# Patient Record
Sex: Male | Born: 1990 | Race: White | Hispanic: No | Marital: Married | Smoking: Current every day smoker
Health system: Southern US, Community
[De-identification: ages and names within clinical notes are randomized; demographics above are authoritative.]

## PROBLEM LIST (undated history)

## (undated) DIAGNOSIS — K259 Gastric ulcer, unspecified as acute or chronic, without hemorrhage or perforation: Secondary | ICD-10-CM

## (undated) DIAGNOSIS — F1110 Opioid abuse, uncomplicated: Secondary | ICD-10-CM

## (undated) DIAGNOSIS — K529 Noninfective gastroenteritis and colitis, unspecified: Secondary | ICD-10-CM

## (undated) DIAGNOSIS — Z765 Malingerer [conscious simulation]: Secondary | ICD-10-CM

## (undated) DIAGNOSIS — Z0289 Encounter for other administrative examinations: Secondary | ICD-10-CM

## (undated) DIAGNOSIS — K589 Irritable bowel syndrome without diarrhea: Secondary | ICD-10-CM

## (undated) DIAGNOSIS — A498 Other bacterial infections of unspecified site: Secondary | ICD-10-CM

## (undated) DIAGNOSIS — N200 Calculus of kidney: Secondary | ICD-10-CM

## (undated) DIAGNOSIS — G8929 Other chronic pain: Secondary | ICD-10-CM

## (undated) DIAGNOSIS — K8020 Calculus of gallbladder without cholecystitis without obstruction: Secondary | ICD-10-CM

## (undated) HISTORY — PX: OTHER SURGICAL HISTORY: SHX169

## (undated) HISTORY — PX: LITHOTRIPSY: SUR834

## (undated) HISTORY — PX: COLONOSCOPY: SHX174

---
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
## 2012-02-08 ENCOUNTER — Emergency Department (HOSPITAL_BASED_OUTPATIENT_CLINIC_OR_DEPARTMENT_OTHER)
Admission: EM | Admit: 2012-02-08 | Discharge: 2012-02-08 | Disposition: A | Discharge status: Home / Self Care | Payer: Self-pay | Attending: Emergency Medicine | Admitting: Emergency Medicine

## 2012-02-08 ENCOUNTER — Encounter (HOSPITAL_BASED_OUTPATIENT_CLINIC_OR_DEPARTMENT_OTHER): Payer: Self-pay | Admitting: *Deleted

## 2012-02-08 DIAGNOSIS — M549 Dorsalgia, unspecified: Secondary | ICD-10-CM | POA: Insufficient documentation

## 2012-02-08 DIAGNOSIS — Z87442 Personal history of urinary calculi: Secondary | ICD-10-CM | POA: Insufficient documentation

## 2012-02-08 DIAGNOSIS — F17200 Nicotine dependence, unspecified, uncomplicated: Secondary | ICD-10-CM | POA: Insufficient documentation

## 2012-02-08 DIAGNOSIS — N23 Unspecified renal colic: Secondary | ICD-10-CM | POA: Insufficient documentation

## 2012-02-08 DIAGNOSIS — R112 Nausea with vomiting, unspecified: Secondary | ICD-10-CM | POA: Insufficient documentation

## 2012-02-08 LAB — URINALYSIS, ROUTINE W REFLEX MICROSCOPIC
Bilirubin Urine: NEGATIVE
Leukocytes, UA: NEGATIVE
Nitrite: NEGATIVE
Specific Gravity, Urine: 1.025 (ref 1.005–1.030)
pH: 5.5 (ref 5.0–8.0)

## 2012-02-08 LAB — URINE MICROSCOPIC-ADD ON

## 2012-02-08 MED ORDER — MORPHINE SULFATE 4 MG/ML IJ SOLN
4.0000 mg | Freq: Once | INTRAMUSCULAR | Status: AC
  Administered 2012-02-08: 4 mg via INTRAVENOUS
  Filled 2012-02-08: qty 1

## 2012-02-08 MED ORDER — ONDANSETRON HCL 4 MG/2ML IJ SOLN
4.0000 mg | Freq: Once | INTRAMUSCULAR | Status: AC
Start: 2012-02-08 — End: 2012-02-08
  Administered 2012-02-08: 4 mg via INTRAVENOUS
  Filled 2012-02-08: qty 2

## 2012-02-08 MED ORDER — OXYCODONE-ACETAMINOPHEN 5-325 MG PO TABS: 2.0000 | ORAL_TABLET | ORAL | Status: DC | PRN

## 2012-02-08 MED ORDER — KETOROLAC TROMETHAMINE 30 MG/ML IJ SOLN
30.0000 mg | Freq: Once | INTRAMUSCULAR | Status: AC
  Administered 2012-02-08: 30 mg via INTRAVENOUS
  Filled 2012-02-08: qty 1

## 2012-02-08 MED ORDER — ONDANSETRON HCL 4 MG PO TABS: 4.0000 mg | ORAL_TABLET | Freq: Four times a day (QID) | ORAL | Status: DC

## 2012-02-08 NOTE — ED Notes (Signed)
Pt amb to room 7 with quick steady gait in nad. Pt states "I think I have another kidney stone" reports sudden onset of right flank pain at 4am with emesis due to pain. Pt is awake and alert, in nad.

## 2012-02-08 NOTE — ED Notes (Signed)
Pt. Aware urine is needed.  Unable to void at this time.

## 2012-02-08 NOTE — ED Provider Notes (Signed)
History     CSN: [id number]  Arrival date & time 02/08/12  1478   First MD Initiated Contact with Patient 02/08/12 [phone]      Chief Complaint  Patient presents with  . Flank Pain    (Consider location/radiation/quality/duration/timing/severity/associated sxs/prior treatment) HPI Comments: Patient presents with right flank pain that started suddenly at 4 AM today. He has a history of a kidney stone once before in August. This is also located on the right side and passed on its own the same day that he left the emergency room. He did not followup with a urologist. He states that he has a constant sharp throbbing pain to his right flank it radiates to his right groin. He has associated nausea and vomiting. Denies any fevers or difficulty urinating. He has not taken anything today for the pain. He states it feels the same as his past renal colic.  Patient is a 21 y.o. male presenting with flank pain.  Flank Pain Associated symptoms include abdominal pain. Pertinent negatives include no chest pain, no headaches and no shortness of breath.    History reviewed. No pertinent past medical history.  History reviewed. No pertinent past surgical history.  History reviewed. No pertinent family history.  History  Substance Use Topics  . Smoking status: Current Every Day Smoker  . Smokeless tobacco: Not on file  . Alcohol Use: No      Review of Systems  Constitutional: Negative for fever, chills, diaphoresis and fatigue.  HENT: Negative for congestion, rhinorrhea and sneezing.   Eyes: Negative.   Respiratory: Negative for cough, chest tightness and shortness of breath.   Cardiovascular: Negative for chest pain and leg swelling.  Gastrointestinal: Positive for nausea, vomiting and abdominal pain. Negative for diarrhea and blood in stool.  Genitourinary: Positive for flank pain. Negative for frequency, hematuria and difficulty urinating.  Musculoskeletal: Positive for back pain. Negative  for arthralgias.  Skin: Negative for rash.  Neurological: Negative for dizziness, speech difficulty, weakness, numbness and headaches.    Allergies  Review of patient's allergies indicates no known allergies.  Home Medications   Current Outpatient Rx  Name  Route  Sig  Dispense  Refill  . ONDANSETRON HCL 4 MG PO TABS   Oral   Take 1 tablet (4 mg total) by mouth every 6 (six) hours.   12 tablet   0   . OXYCODONE-ACETAMINOPHEN 5-325 MG PO TABS   Oral   Take 2 tablets by mouth every 4 (four) hours as needed for pain.   20 tablet   0   . PROMETHAZINE HCL 25 MG PO TABS   Oral   Take 1 tablet (25 mg total) by mouth every 6 (six) hours as needed for nausea.   30 tablet   0     BP 148/84  Pulse 76  Temp 98.2 F (36.8 C) (Oral)  Resp 18  SpO2 100%  Physical Exam  Constitutional: He is oriented to person, place, and time. He appears well-developed and well-nourished.  HENT:  Head: Normocephalic and atraumatic.  Eyes: Pupils are equal, round, and reactive to light.  Neck: Normal range of motion. Neck supple.  Cardiovascular: Normal rate, regular rhythm and normal heart sounds.   Pulmonary/Chest: Effort normal and breath sounds normal. No respiratory distress. He has no wheezes. He has no rales. He exhibits no tenderness.  Abdominal: Soft. Bowel sounds are normal. There is tenderness (positive tenderness to the right flank and right CVA area. There's no pain to  the testicular or inguinal area.). There is no rebound and no guarding.  Musculoskeletal: Normal range of motion. He exhibits no edema.  Lymphadenopathy:    He has no cervical adenopathy.  Neurological: He is alert and oriented to person, place, and time.  Skin: Skin is warm and dry. No rash noted.  Psychiatric: He has a normal mood and affect.    ED Course  Procedures (including critical care time)  Results for orders placed during the hospital encounter of 02/08/12  URINALYSIS, ROUTINE W REFLEX MICROSCOPIC       Component Value Range   Color, Urine AMBER (*) YELLOW   APPearance CLEAR  CLEAR   Specific Gravity, Urine 1.025  1.005 - 1.030   pH 5.5  5.0 - 8.0   Glucose, UA NEGATIVE  NEGATIVE mg/dL   Hgb urine dipstick MODERATE (*) NEGATIVE   Bilirubin Urine NEGATIVE  NEGATIVE   Ketones, ur NEGATIVE  NEGATIVE mg/dL   Protein, ur NEGATIVE  NEGATIVE mg/dL   Urobilinogen, UA 1.0  0.0 - 1.0 mg/dL   Nitrite NEGATIVE  NEGATIVE   Leukocytes, UA NEGATIVE  NEGATIVE  URINE MICROSCOPIC-ADD ON      Component Value Range   Squamous Epithelial / LPF FEW (*) RARE   WBC, UA 3-6  <3 WBC/hpf   RBC / HPF 11-20  <3 RBC/hpf   Bacteria, UA FEW (*) RARE   Urine-Other MUCOUS PRESENT     No results found.   1. Renal colic       MDM  Patient presents right flank pain consistent with his previous renal colic. His pain was controlled with analgesics in the emergency department. His urine does not appear to be infected but is consistent with a kidney stone. I did not feel that he needed further imaging today. I did encourage him to followup with a urologist and I gave him a referral to Dr. Annabell Howells. I gave him a prescription for Percocet and Zofran and advised him to return if his pain is uncontrollable, he develops fevers, vomiting or difficulty urinating. Otherwise she needs to followup with a urologist. He still has the urine strainer at home        Rolan Bucco, MD 02/08/12 1218

## 2012-02-10 LAB — URINE CULTURE
Colony Count: NO GROWTH
Culture: NO GROWTH

## 2012-04-19 ENCOUNTER — Encounter (HOSPITAL_BASED_OUTPATIENT_CLINIC_OR_DEPARTMENT_OTHER): Payer: Self-pay | Admitting: Emergency Medicine

## 2012-04-19 ENCOUNTER — Emergency Department (HOSPITAL_BASED_OUTPATIENT_CLINIC_OR_DEPARTMENT_OTHER)
Admission: EM | Admit: 2012-04-19 | Discharge: 2012-04-19 | Disposition: A | Discharge status: Home / Self Care | Payer: Self-pay | Attending: Emergency Medicine | Admitting: Emergency Medicine

## 2012-04-19 ENCOUNTER — Emergency Department (HOSPITAL_BASED_OUTPATIENT_CLINIC_OR_DEPARTMENT_OTHER): Payer: Self-pay

## 2012-04-19 DIAGNOSIS — F17200 Nicotine dependence, unspecified, uncomplicated: Secondary | ICD-10-CM | POA: Insufficient documentation

## 2012-04-19 DIAGNOSIS — Z79899 Other long term (current) drug therapy: Secondary | ICD-10-CM | POA: Insufficient documentation

## 2012-04-19 DIAGNOSIS — N23 Unspecified renal colic: Secondary | ICD-10-CM | POA: Insufficient documentation

## 2012-04-19 DIAGNOSIS — R110 Nausea: Secondary | ICD-10-CM | POA: Insufficient documentation

## 2012-04-19 DIAGNOSIS — Z87442 Personal history of urinary calculi: Secondary | ICD-10-CM | POA: Insufficient documentation

## 2012-04-19 HISTORY — DX: Calculus of kidney: N20.0

## 2012-04-19 LAB — URINALYSIS, ROUTINE W REFLEX MICROSCOPIC
Nitrite: NEGATIVE
Protein, ur: NEGATIVE mg/dL
Specific Gravity, Urine: 1.025 (ref 1.005–1.030)
Urobilinogen, UA: 1 mg/dL (ref 0.0–1.0)

## 2012-04-19 LAB — URINE MICROSCOPIC-ADD ON

## 2012-04-19 MED ORDER — HYDROMORPHONE HCL 4 MG PO TABS: 4.0000 mg | ORAL_TABLET | ORAL | Status: DC | PRN

## 2012-04-19 MED ORDER — KETOROLAC TROMETHAMINE 15 MG/ML IJ SOLN
15.0000 mg | Freq: Once | INTRAMUSCULAR | Status: AC
  Administered 2012-04-19: 15 mg via INTRAVENOUS
  Filled 2012-04-19: qty 1

## 2012-04-19 MED ORDER — SODIUM CHLORIDE 0.9 % IV SOLN
INTRAVENOUS | Status: DC
  Administered 2012-04-19: 15:00:00 via INTRAVENOUS

## 2012-04-19 MED ORDER — ONDANSETRON 8 MG PO TBDP: 8.0000 mg | ORAL_TABLET | Freq: Three times a day (TID) | ORAL | Status: DC | PRN

## 2012-04-19 MED ORDER — HYDROMORPHONE HCL PF 1 MG/ML IJ SOLN
1.0000 mg | Freq: Once | INTRAMUSCULAR | Status: AC
  Administered 2012-04-19: 1 mg via INTRAVENOUS
  Filled 2012-04-19: qty 1

## 2012-04-19 MED ORDER — ONDANSETRON HCL 4 MG/2ML IJ SOLN
4.0000 mg | Freq: Once | INTRAMUSCULAR | Status: AC
  Administered 2012-04-19: 4 mg via INTRAVENOUS
  Filled 2012-04-19: qty 2

## 2012-04-19 MED ORDER — NAPROXEN SODIUM 220 MG PO TABS: ORAL_TABLET | ORAL | Status: DC

## 2012-04-19 NOTE — ED Provider Notes (Signed)
History     CSN: [id number]  Arrival date & time 04/19/12  1238   First MD Initiated Contact with Patient 04/19/12 1446      Chief Complaint  Patient presents with  . Flank Pain    (Consider location/radiation/quality/duration/timing/severity/associated sxs/prior treatment) HPI This is a 22 year old male with a history of kidney stones. He has been having left flank pain for about a week. He states that during this time he has passed 3 kidney stones. He states the pain had been tolerable, controlled with Percocet he had from a prior visit for kidney stone. This morning about 5 AM his pain worsened and is no longer controlled by his medications. He describes the pain as severe. The pain radiates to his left testicle. There is some exacerbation with palpation. He has been nauseated but has not vomited. He denies fever or chills. He has not noted gross hematuria. The pain is characterized as like that of prior kidney stones.  Past Medical History  Diagnosis Date  . Kidney stone     No past surgical history on file.  No family history on file.  History  Substance Use Topics  . Smoking status: Current Every Day Smoker  . Smokeless tobacco: Not on file  . Alcohol Use: No      Review of Systems  All other systems reviewed and are negative.    Allergies  Review of patient's allergies indicates no known allergies.  Home Medications   Current Outpatient Rx  Name  Route  Sig  Dispense  Refill  . oxyCODONE-acetaminophen (PERCOCET/ROXICET) 5-325 MG per tablet   Oral   Take 2 tablets by mouth every 4 (four) hours as needed for pain.   20 tablet   0   . HYDROmorphone (DILAUDID) 4 MG tablet   Oral   Take 1 tablet (4 mg total) by mouth every 4 (four) hours as needed for pain.   30 tablet   0   . naproxen sodium (ALEVE) 220 MG tablet      Take 2 tablets twice daily until stone passes.         Marland Kitchen ondansetron (ZOFRAN ODT) 8 MG disintegrating tablet   Oral   Take 1  tablet (8 mg total) by mouth every 8 (eight) hours as needed for nausea.   10 tablet   0   . ondansetron (ZOFRAN) 4 MG tablet   Oral   Take 1 tablet (4 mg total) by mouth every 6 (six) hours.   12 tablet   0   . EXPIRED: promethazine (PHENERGAN) 25 MG tablet   Oral   Take 1 tablet (25 mg total) by mouth every 6 (six) hours as needed for nausea.   30 tablet   0     BP 114/72  Pulse 79  Temp(Src) 98.2 F (36.8 C) (Oral)  Resp 16  Ht 5\' 9"  (1.753 m)  Wt 212 lb (96.163 kg)  BMI 31.29 kg/m2  SpO2 99%  Physical Exam General: Well-developed, well-nourished male in no acute distress; appearance consistent with age of record HENT: normocephalic, atraumatic Eyes: pupils equal round and reactive to light; extraocular muscles intact Neck: supple Heart: regular rate and rhythm Lungs: clear to auscultation bilaterally Abdomen: soft; nondistended; mild left lower quadrant tenderness; bowel sounds present GU: Left CVA tenderness Extremities: No deformity; full range of motion Neurologic: Awake, alert and oriented; motor function intact in all extremities and symmetric; no facial droop Skin: Warm and dry Psychiatric: Flat affect  ED Course  Procedures (including critical care time)     MDM   Nursing notes and vitals signs, including pulse oximetry, reviewed.  Summary of this visit's results, reviewed by myself:  Labs:  No results found for this or any previous visit (from the past 24 hour(s)).  Imaging Studies: Ct Abdomen Pelvis Wo Contrast  04/19/2012  *RADIOLOGY REPORT*  Clinical Data: Bilateral flank pain  CT ABDOMEN AND PELVIS WITHOUT CONTRAST  Technique:  Multidetector CT imaging of the abdomen and pelvis was performed following the standard protocol without intravenous contrast.  Comparison: 10/19/2011  Findings: There is no pericardial or pleural effusion identified.  Focal low attenuation structure within the within the medial segment of the left hepatic lobe  adjacent to the falciform ligament is identified measuring 8 mm.  This was not seen on the previous exam.  Gallbladder appears normal.  No biliary dilatation.  The pancreas is unremarkable.  Normal appearance of the spleen.  The adrenal glands are both normal.  Multiple right renal calculi identified.  The largest is in the inferior pole of the right kidney measuring 4 mm, image 38.  There are multiple left renal calculi.  The largest left renal stone is in the inferior pole measuring 3 mm, image number 36.  Left-sided pelvocaliectasis and hydroureter is noted.  At the left UVJ there is a 3 mm stone, image 80.  The prostate gland and seminal vesicles appear normal.  There are no enlarged upper abdominal lymph nodes.  There is no pelvic or inguinal adenopathy identified.  No free fluid or fluid collections identified within the abdomen or pelvis.  The stomach is normal.  The small bowel loops are unremarkable. The appendix is visualized and appears normal.  Normal appearance of the colon.  Review of the visualized bony structures unremarkable.  IMPRESSION:  1.  Bilateral renal calculi. 2.  Stone within the left UVJ measures 3 mm. 3.  Low attenuation structure within the liver adjacent to the falciform ligament is new from previous exam and likely represents an area of focal fatty deposition.   Original Report Authenticated By: Signa Kell, M.D.             Hanley Seamen, MD 04/20/12 2257

## 2012-04-19 NOTE — ED Notes (Signed)
MD at bedside. 

## 2012-04-19 NOTE — ED Notes (Signed)
Pt having left flank pain radiating to testicle.  Pt states its a kidney stone.  No known fever.  Some N/V this am.  None currently.

## 2012-04-19 NOTE — ED Notes (Signed)
Patient transported to CT 

## 2013-11-14 ENCOUNTER — Emergency Department (HOSPITAL_BASED_OUTPATIENT_CLINIC_OR_DEPARTMENT_OTHER)
Admission: EM | Admit: 2013-11-14 | Discharge: 2013-11-14 | Disposition: A | Discharge status: Home / Self Care | Payer: Managed Care, Other (non HMO) | Attending: Emergency Medicine | Admitting: Emergency Medicine

## 2013-11-14 ENCOUNTER — Emergency Department (HOSPITAL_BASED_OUTPATIENT_CLINIC_OR_DEPARTMENT_OTHER): Payer: Managed Care, Other (non HMO)

## 2013-11-14 ENCOUNTER — Encounter (HOSPITAL_BASED_OUTPATIENT_CLINIC_OR_DEPARTMENT_OTHER): Payer: Self-pay | Admitting: Emergency Medicine

## 2013-11-14 DIAGNOSIS — R109 Unspecified abdominal pain: Secondary | ICD-10-CM | POA: Diagnosis present

## 2013-11-14 DIAGNOSIS — R198 Other specified symptoms and signs involving the digestive system and abdomen: Secondary | ICD-10-CM | POA: Insufficient documentation

## 2013-11-14 DIAGNOSIS — N200 Calculus of kidney: Secondary | ICD-10-CM

## 2013-11-14 DIAGNOSIS — Z791 Long term (current) use of non-steroidal anti-inflammatories (NSAID): Secondary | ICD-10-CM | POA: Insufficient documentation

## 2013-11-14 DIAGNOSIS — Z79899 Other long term (current) drug therapy: Secondary | ICD-10-CM | POA: Insufficient documentation

## 2013-11-14 DIAGNOSIS — F17200 Nicotine dependence, unspecified, uncomplicated: Secondary | ICD-10-CM | POA: Insufficient documentation

## 2013-11-14 LAB — BASIC METABOLIC PANEL
ANION GAP: 14 (ref 5–15)
BUN: 11 mg/dL (ref 6–23)
CALCIUM: 9.5 mg/dL (ref 8.4–10.5)
CO2: 27 mEq/L (ref 19–32)
Chloride: 100 mEq/L (ref 96–112)
Creatinine, Ser: 1 mg/dL (ref 0.50–1.35)
GFR calc Af Amer: >90 mL/min (ref >90)
Glucose, Bld: 92 mg/dL (ref 70–99)
Potassium: 4.4 mEq/L (ref 3.7–5.3)
SODIUM: 141 meq/L (ref 137–147)

## 2013-11-14 LAB — CBC WITH DIFFERENTIAL/PLATELET
BASOS ABS: 0.1 10*3/uL (ref 0.0–0.1)
Basophils Relative: 1 % (ref 0–1)
EOS ABS: 0.2 10*3/uL (ref 0.0–0.7)
EOS PCT: 3 % (ref 0–5)
HCT: 46.7 % (ref 39.0–52.0)
Hemoglobin: 16.3 g/dL (ref 13.0–17.0)
LYMPHS PCT: 26 % (ref 12–46)
Lymphs Abs: 2.1 10*3/uL (ref 0.7–4.0)
MCH: 29.6 pg (ref 26.0–34.0)
MCHC: 34.9 g/dL (ref 30.0–36.0)
MCV: 84.9 fL (ref 78.0–100.0)
Monocytes Absolute: 0.6 10*3/uL (ref 0.1–1.0)
Monocytes Relative: 8 % (ref 3–12)
Neutro Abs: 5 10*3/uL (ref 1.7–7.7)
Neutrophils Relative %: 62 % (ref 43–77)
PLATELETS: 328 10*3/uL (ref 150–400)
RBC: 5.5 MIL/uL (ref 4.22–5.81)
RDW: 12.2 % (ref 11.5–15.5)
WBC: 8 10*3/uL (ref 4.0–10.5)

## 2013-11-14 LAB — URINALYSIS, ROUTINE W REFLEX MICROSCOPIC
Bilirubin Urine: NEGATIVE
Glucose, UA: NEGATIVE mg/dL
HGB URINE DIPSTICK: NEGATIVE
Ketones, ur: NEGATIVE mg/dL
Leukocytes, UA: NEGATIVE
Nitrite: NEGATIVE
PROTEIN: NEGATIVE mg/dL
Specific Gravity, Urine: 1.023 (ref 1.005–1.030)
UROBILINOGEN UA: 0.2 mg/dL (ref 0.0–1.0)
pH: 7 (ref 5.0–8.0)

## 2013-11-14 MED ORDER — TAMSULOSIN HCL 0.4 MG PO CAPS: 0.4000 mg | ORAL_CAPSULE | Freq: Every day | ORAL | Status: DC

## 2013-11-14 MED ORDER — ONDANSETRON HCL 4 MG/2ML IJ SOLN
4.0000 mg | Freq: Once | INTRAMUSCULAR | Status: AC
  Administered 2013-11-14: 4 mg via INTRAVENOUS
  Filled 2013-11-14: qty 2

## 2013-11-14 MED ORDER — HYDROMORPHONE HCL 1 MG/ML IJ SOLN
1.0000 mg | Freq: Once | INTRAMUSCULAR | Status: AC
  Administered 2013-11-14: 1 mg via INTRAVENOUS
  Filled 2013-11-14: qty 1

## 2013-11-14 MED ORDER — OXYCODONE-ACETAMINOPHEN 5-325 MG PO TABS: 1.0000 | ORAL_TABLET | ORAL | Status: DC | PRN

## 2013-11-14 MED ORDER — SODIUM CHLORIDE 0.9 % IV BOLUS (SEPSIS)
1000.0000 mL | Freq: Once | INTRAVENOUS | Status: AC
  Administered 2013-11-14: 1000 mL via INTRAVENOUS

## 2013-11-14 MED ORDER — ONDANSETRON HCL 4 MG PO TABS: 4.0000 mg | ORAL_TABLET | Freq: Three times a day (TID) | ORAL | Status: DC | PRN

## 2013-11-14 MED ORDER — KETOROLAC TROMETHAMINE 30 MG/ML IJ SOLN
30.0000 mg | Freq: Once | INTRAMUSCULAR | Status: AC
  Administered 2013-11-14: 30 mg via INTRAVENOUS
  Filled 2013-11-14: qty 1

## 2013-11-14 NOTE — ED Notes (Signed)
Pt reports left flank x 1 day- has hx of kidney stones

## 2013-11-14 NOTE — Discharge Instructions (Signed)
Flank Pain °Flank pain refers to pain that is located on the side of the body between the upper abdomen and the back. The pain may occur over a short period of time (acute) or may be long-term or reoccurring (chronic). It may be mild or severe. Flank pain can be caused by many things. °CAUSES  °Some of the more common causes of flank pain include: °· Muscle strains.   °· Muscle spasms.   °· A disease of your spine (vertebral disk disease).   °· A lung infection (pneumonia).   °· Fluid around your lungs (pulmonary edema).   °· A kidney infection.   °· Kidney stones.   °· A very painful skin rash caused by the chickenpox virus (shingles).   °· Gallbladder disease.   °HOME CARE INSTRUCTIONS  °Home care will depend on the cause of your pain. In general, °· Rest as directed by your caregiver. °· Drink enough fluids to keep your urine clear or pale yellow. °· Only take over-the-counter or prescription medicines as directed by your caregiver. Some medicines may help relieve the pain. °· Tell your caregiver about any changes in your pain. °· Follow up with your caregiver as directed. °SEEK IMMEDIATE MEDICAL CARE IF:  °· Your pain is not controlled with medicine.   °· You have new or worsening symptoms. °· Your pain increases.   °· You have abdominal pain.   °· You have shortness of breath.   °· You have persistent nausea or vomiting.   °· You have swelling in your abdomen.   °· You feel faint or pass out.   °· You have blood in your urine. °· You have a fever or persistent symptoms for more than 2-3 days. °· You have a fever and your symptoms suddenly get worse. °MAKE SURE YOU:  °· Understand these instructions. °· Will watch your condition. °· Will get help right away if you are not doing well or get worse. °Document Released: 04/02/2005 Document Revised: 11/04/2011 Document Reviewed: 09/24/2011 °[employer]® Patient Information ©2015 [employer]. This information is not intended to replace advice given to you by your  health care provider. Make sure you discuss any questions you have with your health care provider. ° °

## 2013-11-14 NOTE — ED Provider Notes (Signed)
TIME SEEN: 2:13 PM  CHIEF COMPLAINT: Left flank pain, nausea and vomiting  HPI: Patient is a 23 year old male with history of prior kidney stones, he has had a prior 9 mm sternum requiring retrieval and ureteral stent placement by urologist in [place] who presents emergency department with left flank pain that occurs prior to arrival with associated nausea vomiting, dysuria hematuria. Denies fevers, chills, diarrhea. Has had radiation of pain into his testicles but no testicular mass or scrotal swelling. No penile discharge.  ROS: See HPI Constitutional: no fever  Eyes: no drainage  ENT: no runny nose   Cardiovascular:  no chest pain  Resp: no SOB  GI:  vomiting GU: no dysuria Integumentary: no rash  Allergy: no hives  Musculoskeletal: no leg swelling  Neurological: no slurred speech ROS otherwise negative  PAST MEDICAL HISTORY/PAST SURGICAL HISTORY:  Past Medical History  Diagnosis Date  . Kidney stone     MEDICATIONS:  Prior to Admission medications   Medication Sig Start Date End Date Taking? Authorizing Provider  ketorolac (TORADOL) 10 MG tablet Take 10 mg by mouth every 6 (six) hours as needed.   Yes Historical Provider, MD  ondansetron (ZOFRAN) 4 MG tablet Take 1 tablet (4 mg total) by mouth every 6 (six) hours. 02/08/12  Yes Rolan Bucco, MD  promethazine (PHENERGAN) 25 MG tablet Take 1 tablet (25 mg total) by mouth every 6 (six) hours as needed for nausea. 10/19/11 11/14/13 Yes Rolan Bucco, MD  HYDROmorphone (DILAUDID) 4 MG tablet Take 1 tablet (4 mg total) by mouth every 4 (four) hours as needed for pain. 04/19/12   Carlisle Beers Molpus, MD  naproxen sodium (ALEVE) 220 MG tablet Take 2 tablets twice daily until stone passes. 04/19/12   John L Molpus, MD  ondansetron (ZOFRAN ODT) 8 MG disintegrating tablet Take 1 tablet (8 mg total) by mouth every 8 (eight) hours as needed for nausea. 04/19/12   Carlisle Beers Molpus, MD  oxyCODONE-acetaminophen (PERCOCET/ROXICET) 5-325 MG per tablet Take  2 tablets by mouth every 4 (four) hours as needed for pain. 02/08/12   Rolan Bucco, MD    ALLERGIES:  No Known Allergies  SOCIAL HISTORY:  History  Substance Use Topics  . Smoking status: Current Every Day Smoker    Types: Cigarettes  . Smokeless tobacco: Never Used  . Alcohol Use: No    FAMILY HISTORY: No family history on file.  EXAM: BP 110/95  Pulse 95  Temp(Src) 98.4 F (36.9 C) (Oral)  Resp 18  Ht  (1.753 m)  Wt 220 lb (99.791 kg)  BMI 32.47 kg/m2  SpO2 99% CONSTITUTIONAL: Alert and oriented and responds appropriately to questions. Well-appearing; well-nourished HEAD: Normocephalic EYES: Conjunctivae clear, PERRL ENT: normal nose; no rhinorrhea; moist mucous membranes; pharynx without lesions noted NECK: Supple, no meningismus, no LAD  CARD: RRR; S1 and S2 appreciated; no murmurs, no clicks, no rubs, no gallops RESP: Normal chest excursion without splinting or tachypnea; breath sounds clear and equal bilaterally; no wheezes, no rhonchi, no rales,  ABD/GI: Normal bowel sounds; non-distended; soft, non-tender, no rebound, no guarding GU:  Normal external genitalia, she is tender to palpation of her bilateral testicles without appreciable masses, no scrotal masses, 2+ femoral pulses bilaterally, no penile discharge, normal urethral meatus, circumcised, no lesions noted on the shaft or head of the penis BACK:  The back appears normal and is non-tender to palpation, there is no CVA tenderness EXT: Normal ROM in all joints; non-tender to palpation; no edema; normal  capillary refill; no cyanosis    SKIN: Normal color for age and race; warm NEURO: Moves all extremities equally PSYCH: The patient's mood and manner are appropriate. Grooming and personal hygiene are appropriate.  MEDICAL DECISION MAKING: Patient here with flank pain, nausea vomiting, dysuria and hematuria consistent with his prior episodes of renal colic. He has had 2 CT scans in our system that have  confirmed ureterolithiasis. He is nontoxic appearing but does appear uncomfortable. Hemodynamically full. We'll check basic labs to insure no electrolyte abnormality from vomiting.  Will check urinalysis for infection. We'll give IV fluids, pain and nausea medication and reassess. I do not feel he needs repeat imaging at this time.  ED PROGRESS: Patient now reports all his penis and scrotum. He now appears to be more tender in this area. No high riding testicle. His urine shows absolute no sign of blood or infection. He states that he has similar symptoms with kidney stones but given his normal urine, I am concerned we could be missing something else including torsion, epididymitis. We'll obtain a scrotal ultrasound. We'll re-dose pain medication.  Signed out to Dr. Littie Deeds to follow up on [place].     Layla Maw Shermar Friedland, DO 11/14/13 1504

## 2013-11-14 NOTE — ED Provider Notes (Signed)
Physical Exam  BP 105/75  Pulse 79  Temp(Src) 98.4 F (36.9 C) (Oral)  Resp 18  Ht  (1.753 m)  Wt 220 lb (99.791 kg)  BMI 32.47 kg/m2  SpO2 97%  Physical Exam Results for orders placed during the hospital encounter of 11/14/13  URINALYSIS, ROUTINE W REFLEX MICROSCOPIC      Result Value Ref Range   Color, Urine YELLOW  YELLOW   APPearance CLEAR  CLEAR   Specific Gravity, Urine 1.023  1.005 - 1.030   pH 7.0  5.0 - 8.0   Glucose, UA NEGATIVE  NEGATIVE mg/dL   Hgb urine dipstick NEGATIVE  NEGATIVE   Bilirubin Urine NEGATIVE  NEGATIVE   Ketones, ur NEGATIVE  NEGATIVE mg/dL   Protein, ur NEGATIVE  NEGATIVE mg/dL   Urobilinogen, UA 0.2  0.0 - 1.0 mg/dL   Nitrite NEGATIVE  NEGATIVE   Leukocytes, UA NEGATIVE  NEGATIVE  CBC WITH DIFFERENTIAL      Result Value Ref Range   WBC 8.0  4.0 - 10.5 K/uL   RBC 5.50  4.22 - 5.81 MIL/uL   Hemoglobin 16.3  13.0 - 17.0 g/dL   HCT 60.4  54.0 - 98.1 %   MCV 84.9  78.0 - 100.0 fL   MCH 29.6  26.0 - 34.0 pg   MCHC 34.9  30.0 - 36.0 g/dL   RDW 19.1  47.8 - 29.5 %   Platelets 328  150 - 400 K/uL   Neutrophils Relative % 62  43 - 77 %   Neutro Abs 5.0  1.7 - 7.7 K/uL   Lymphocytes Relative 26  12 - 46 %   Lymphs Abs 2.1  0.7 - 4.0 K/uL   Monocytes Relative 8  3 - 12 %   Monocytes Absolute 0.6  0.1 - 1.0 K/uL   Eosinophils Relative 3  0 - 5 %   Eosinophils Absolute 0.2  0.0 - 0.7 K/uL   Basophils Relative 1  0 - 1 %   Basophils Absolute 0.1  0.0 - 0.1 K/uL  BASIC METABOLIC PANEL      Result Value Ref Range   Sodium 141  137 - 147 mEq/L   Potassium 4.4  3.7 - 5.3 mEq/L   Chloride 100  96 - 112 mEq/L   CO2 27  19 - 32 mEq/L   Glucose, Bld 92  70 - 99 mg/dL   BUN 11  6 - 23 mg/dL   Creatinine, Ser 6.21  0.50 - 1.35 mg/dL   Calcium 9.5  8.4 - 30.8 mg/dL   GFR calc non Af Amer >90  >90 mL/min   GFR calc Af Amer >90  >90 mL/min   Anion gap 14  5 - 15    US SCROTUM   Final Result:      US RENAL   Final Result:      [place] ART/VEN  FLOW ABD PELV DOPPLER   Final Result:        US Scrotum (Final result)  Result time: 11/14/13 16:23:16    Final result by Rad Results In Interface (11/14/13 16:23:16)    Narrative:   CLINICAL DATA: Left testicular and left flank pain. History of renal calculi.  EXAM: SCROTAL ULTRASOUND  DOPPLER ULTRASOUND OF THE TESTICLES  TECHNIQUE: Complete ultrasound examination of the testicles, epididymis, and other scrotal structures was performed. Color and spectral Doppler ultrasound were also utilized to evaluate blood flow to the testicles.  COMPARISON: None.  FINDINGS: Right testicle  Measurements: 4.2 by 2.4 by 3.1 cm. No mass or microlithiasis visualized.  Left testicle  Measurements: 4.0 by 2.4 by 2.7 cm. No mass or microlithiasis visualized.  Right epididymis: Normal in size and appearance.  Left epididymis: Normal in size and appearance.  Hydrocele: None visualized.  Varicocele: None visualized.  Pulsed Doppler interrogation of both testes demonstrates low resistance arterial and venous waveforms bilaterally.  IMPRESSION: 1. Normal sonographic appearance of the scrotum/testes. Normal Doppler examination.   Electronically Signed By: Herbie Baltimore M.D. On: 11/14/2013 16:23             [place] Art/Ven Flow Abd Pelv Doppler (Final result)  Result time: 11/14/13 16:23:16    Final result by Rad Results In Interface (11/14/13 16:23:16)    Narrative:   CLINICAL DATA: Left testicular and left flank pain. History of renal calculi.  EXAM: SCROTAL ULTRASOUND  DOPPLER ULTRASOUND OF THE TESTICLES  TECHNIQUE: Complete ultrasound examination of the testicles, epididymis, and other scrotal structures was performed. Color and spectral Doppler ultrasound were also utilized to evaluate blood flow to the testicles.  COMPARISON: None.  FINDINGS: Right testicle  Measurements: 4.2 by 2.4 by 3.1 cm. No mass or microlithiasis visualized.  Left  testicle  Measurements: 4.0 by 2.4 by 2.7 cm. No mass or microlithiasis visualized.  Right epididymis: Normal in size and appearance.  Left epididymis: Normal in size and appearance.  Hydrocele: None visualized.  Varicocele: None visualized.  Pulsed Doppler interrogation of both testes demonstrates low resistance arterial and venous waveforms bilaterally.  IMPRESSION: 1. Normal sonographic appearance of the scrotum/testes. Normal Doppler examination.   Electronically Signed By: Herbie Baltimore M.D. On: 11/14/2013 16:23             US Renal (Final result)  Result time: 11/14/13 16:22:00    Final result by Rad Results In Interface (11/14/13 16:22:00)    Narrative:   CLINICAL DATA: Left flank pain. History of renal stones. Evaluate for hydronephrosis.  EXAM: RENAL/URINARY TRACT ULTRASOUND COMPLETE  COMPARISON: CT abdomen and pelvis August 08, 2013  FINDINGS: Right Kidney:  Length: 10.1 cm. There are small nonobstructing stones. No mass or hydronephrosis visualized.  Left Kidney:  Length: 10. There are small nonobstructing stones. No mass or hydronephrosis visualized.  Bladder:  Appears normal for degree of bladder distention. Bilateral ureteral jets are noted.  IMPRESSION: Bilateral nephrolithiasis. No hydronephrosis bilaterally.   Electronically Signed By: Sherian Rein M.D. On: 11/14/2013 16:22     ED Course  Procedures  MDM Assumed care of patient from Dr. Elesa Massed.  Briefly patient is presenting with left groin pain which he states is identical to prior nephrolithiasis. Assumption of care awaiting ultrasound of scrotum (patient has a history of prior epididymitis), and kidneys.  This returned as above without significant abnormality with the exception of bilateral nephrolithiasis. There is no hydronephrosis or emergent scrotal testicular pathology. Patient was given standard return precautions for nephrolithiasis and for scrotal pain. The primary  encounter diagnosis was Nephrolithiasis. A diagnosis of Left flank pain was also pertinent to this visit.       Mirian Mo, MD 11/14/13 [id number]

## 2013-12-26 DIAGNOSIS — N200 Calculus of kidney: Secondary | ICD-10-CM | POA: Insufficient documentation

## 2014-01-06 ENCOUNTER — Emergency Department (HOSPITAL_BASED_OUTPATIENT_CLINIC_OR_DEPARTMENT_OTHER)
Admission: EM | Admit: 2014-01-06 | Discharge: 2014-01-07 | Disposition: A | Discharge status: Home / Self Care | Payer: Managed Care, Other (non HMO) | Attending: Emergency Medicine | Admitting: Emergency Medicine

## 2014-01-06 ENCOUNTER — Encounter (HOSPITAL_BASED_OUTPATIENT_CLINIC_OR_DEPARTMENT_OTHER): Payer: Self-pay | Admitting: Emergency Medicine

## 2014-01-06 DIAGNOSIS — Z79899 Other long term (current) drug therapy: Secondary | ICD-10-CM | POA: Diagnosis not present

## 2014-01-06 DIAGNOSIS — Z87442 Personal history of urinary calculi: Secondary | ICD-10-CM | POA: Diagnosis not present

## 2014-01-06 DIAGNOSIS — Z720 Tobacco use: Secondary | ICD-10-CM | POA: Diagnosis not present

## 2014-01-06 DIAGNOSIS — R109 Unspecified abdominal pain: Secondary | ICD-10-CM | POA: Diagnosis present

## 2014-01-06 DIAGNOSIS — N23 Unspecified renal colic: Secondary | ICD-10-CM

## 2014-01-06 DIAGNOSIS — Z791 Long term (current) use of non-steroidal anti-inflammatories (NSAID): Secondary | ICD-10-CM | POA: Diagnosis not present

## 2014-01-06 LAB — COMPREHENSIVE METABOLIC PANEL
ALT: 55 U/L — ABNORMAL HIGH (ref 0–53)
ANION GAP: 12 (ref 5–15)
AST: 30 U/L (ref 0–37)
Albumin: 3.5 g/dL (ref 3.5–5.2)
Alkaline Phosphatase: 100 U/L (ref 39–117)
BUN: 12 mg/dL (ref 6–23)
CALCIUM: 9.3 mg/dL (ref 8.4–10.5)
CO2: 26 mEq/L (ref 19–32)
Chloride: 100 mEq/L (ref 96–112)
Creatinine, Ser: 0.9 mg/dL (ref 0.50–1.35)
GFR calc Af Amer: >90 mL/min (ref >90)
GFR calc non Af Amer: >90 mL/min (ref >90)
Glucose, Bld: 115 mg/dL — ABNORMAL HIGH (ref 70–99)
Potassium: 4.2 mEq/L (ref 3.7–5.3)
Sodium: 138 mEq/L (ref 137–147)
Total Bilirubin: 0.2 mg/dL — ABNORMAL LOW (ref 0.3–1.2)
Total Protein: 7.2 g/dL (ref 6.0–8.3)

## 2014-01-06 LAB — URINALYSIS, ROUTINE W REFLEX MICROSCOPIC
Bilirubin Urine: NEGATIVE
Glucose, UA: NEGATIVE mg/dL
KETONES UR: NEGATIVE mg/dL
Leukocytes, UA: NEGATIVE
NITRITE: NEGATIVE
PH: 6 (ref 5.0–8.0)
PROTEIN: NEGATIVE mg/dL
Specific Gravity, Urine: 1.013 (ref 1.005–1.030)
Urobilinogen, UA: 0.2 mg/dL (ref 0.0–1.0)

## 2014-01-06 LAB — CBC WITH DIFFERENTIAL/PLATELET
BASOS ABS: 0.1 10*3/uL (ref 0.0–0.1)
Basophils Relative: 1 % (ref 0–1)
EOS ABS: 0.3 10*3/uL (ref 0.0–0.7)
Eosinophils Relative: 5 % (ref 0–5)
HEMATOCRIT: 44.2 % (ref 39.0–52.0)
HEMOGLOBIN: 15.2 g/dL (ref 13.0–17.0)
Lymphocytes Relative: 38 % (ref 12–46)
Lymphs Abs: 2.5 10*3/uL (ref 0.7–4.0)
MCH: 29.3 pg (ref 26.0–34.0)
MCHC: 34.4 g/dL (ref 30.0–36.0)
MCV: 85.3 fL (ref 78.0–100.0)
MONOS PCT: 9 % (ref 3–12)
Monocytes Absolute: 0.6 10*3/uL (ref 0.1–1.0)
Neutro Abs: 3.1 10*3/uL (ref 1.7–7.7)
Neutrophils Relative %: 47 % (ref 43–77)
Platelets: 308 10*3/uL (ref 150–400)
RBC: 5.18 MIL/uL (ref 4.22–5.81)
RDW: 12.3 % (ref 11.5–15.5)
WBC: 6.6 10*3/uL (ref 4.0–10.5)

## 2014-01-06 LAB — URINE MICROSCOPIC-ADD ON

## 2014-01-06 MED ORDER — HYDROMORPHONE HCL 1 MG/ML IJ SOLN
1.0000 mg | Freq: Once | INTRAMUSCULAR | Status: AC
  Administered 2014-01-06: 1 mg via INTRAVENOUS
  Filled 2014-01-06: qty 1

## 2014-01-06 MED ORDER — KETOROLAC TROMETHAMINE 30 MG/ML IJ SOLN
30.0000 mg | Freq: Once | INTRAMUSCULAR | Status: AC
  Administered 2014-01-06: 30 mg via INTRAVENOUS
  Filled 2014-01-06: qty 1

## 2014-01-06 MED ORDER — ONDANSETRON HCL 4 MG/2ML IJ SOLN
4.0000 mg | Freq: Once | INTRAMUSCULAR | Status: AC
  Administered 2014-01-06: 4 mg via INTRAVENOUS
  Filled 2014-01-06: qty 2

## 2014-01-06 NOTE — ED Provider Notes (Signed)
CSN: [id number]     Arrival date & time 01/06/14  2030 History  This chart was scribed for Ricardo Raceravid Dimetrius Montfort, MD by Roxy Cedarhandni Bhalodia, ED Scribe. This patient was seen in room MH10/MH10 and the patient's care was started at 11:25 PM.   Chief Complaint  Patient presents with  . Flank Pain   Patient is a 23 y.o. male presenting with flank pain. The history is provided by the patient. No language interpreter was used.  Flank Pain This is a new problem. The current episode started 2 days ago. The problem occurs constantly. The problem has not changed since onset.Pertinent negatives include no chest pain, no abdominal pain, no headaches and no shortness of breath.   HPI Comments: Ricardo Sweeney is a 23 y.o. male who presents to the Emergency Department complaining of right flank pain that radiates to groin area that began 2 nights ago. Patient states he passed a stone 2 mornings ago. He had a lipotripsy done 5 days ago and given percocet 10mg . He states that percocet helped relieve pain but he ran out on Thursday before onset of current pain. He reports associated nausea and had 1 episode of emesis upon arrival. Patient states that he also currently takes Phenergan. His next appointment with Urologist is on 01/15/14. He denies fever or chills. Patient is asking for refill of his Percocet and Toradol.   Past Medical History  Diagnosis Date  . Kidney stone   . Kidney stone    Past Surgical History  Procedure Laterality Date  . Lithotripsy     History reviewed. No pertinent family history. History  Substance Use Topics  . Smoking status: Current Every Day Smoker    Types: Cigarettes  . Smokeless tobacco: Never Used  . Alcohol Use: No   Review of Systems  Constitutional: Negative for fever and chills.  Respiratory: Negative for shortness of breath.   Cardiovascular: Negative for chest pain.  Gastrointestinal: Positive for nausea and vomiting. Negative for abdominal pain.  Genitourinary:  Positive for flank pain. Negative for dysuria, frequency, hematuria, penile pain and testicular pain.  Skin: Negative for rash and wound.  Neurological: Negative for headaches.  All other systems reviewed and are negative.  Allergies  Review of patient's allergies indicates no known allergies.  Home Medications   Prior to Admission medications   Medication Sig Start Date End Date Taking? Authorizing Provider  HYDROmorphone (DILAUDID) 4 MG tablet Take 1 tablet (4 mg total) by mouth every 4 (four) hours as needed for pain. 04/19/12   Carlisle BeersJohn L Molpus, MD  ketorolac (TORADOL) 10 MG tablet Take 1 tablet (10 mg total) by mouth every 6 (six) hours as needed. 01/07/14   Ricardo Raceravid Shonta Bourque, MD  ondansetron (ZOFRAN ODT) 8 MG disintegrating tablet Take 1 tablet (8 mg total) by mouth every 8 (eight) hours as needed for nausea. 04/19/12   John L Molpus, MD  ondansetron (ZOFRAN) 4 MG tablet Take 1 tablet (4 mg total) by mouth every 6 (six) hours. 02/08/12   Rolan BuccoMelanie Belfi, MD  ondansetron (ZOFRAN) 4 MG tablet Take 1 tablet (4 mg total) by mouth every 8 (eight) hours as needed for nausea or vomiting. 11/14/13   Mirian MoMatthew Gentry, MD  oxyCODONE-acetaminophen (PERCOCET/ROXICET) 5-325 MG per tablet Take 2 tablets by mouth every 4 (four) hours as needed for severe pain. 01/07/14   Ricardo Raceravid Ashland Osmer, MD  promethazine (PHENERGAN) 25 MG tablet Take 1 tablet (25 mg total) by mouth every 6 (six) hours as needed for nausea. 10/19/11 11/14/13  Rolan BuccoMelanie Belfi, MD  tamsulosin (FLOMAX) 0.4 MG CAPS capsule Take 1 capsule (0.4 mg total) by mouth daily. 11/14/13   Mirian MoMatthew Gentry, MD   Triage Vitals: BP 152/108 mmHg  Pulse 82  Temp(Src) 98.8 F (37.1 C) (Oral)  Resp 20  Ht 5\' 9"  (1.753 m)  Wt 230 lb (104.327 kg)  BMI 33.95 kg/m2  SpO2 98% Physical Exam  Constitutional: He is oriented to person, place, and time. He appears well-developed and well-nourished. No distress.  HENT:  Head: Normocephalic and atraumatic.  Mouth/Throat:  Oropharynx is clear and moist.  Eyes: EOM are normal. Pupils are equal, round, and reactive to light.  Neck: Normal range of motion. Neck supple.  Cardiovascular: Normal rate and regular rhythm.   Pulmonary/Chest: Effort normal and breath sounds normal. No respiratory distress. He has no wheezes. He has no rales.  Abdominal: Soft. Bowel sounds are normal. He exhibits no distension and no mass. There is no tenderness. There is no rebound and no guarding.  Musculoskeletal: Normal range of motion. He exhibits no edema or tenderness.  No CVA tenderness bilaterally.  Neurological: He is alert and oriented to person, place, and time.  Skin: Skin is warm and dry. No rash noted. No erythema.  Psychiatric: He has a normal mood and affect. His behavior is normal.  Nursing note and vitals reviewed.   ED Course  Procedures (including critical care time)  DIAGNOSTIC STUDIES: Oxygen Saturation is 98% on RA, normal by my interpretation.    COORDINATION OF CARE: 11:29 PM- Discussed plans to order diagnostic lab work. Will give patient medication for pain management. Pt advised of plan for treatment and pt agrees.  Labs Review Labs Reviewed  URINALYSIS, ROUTINE W REFLEX MICROSCOPIC - Abnormal; Notable for the following:    Hgb urine dipstick LARGE (*)    All other components within normal limits  URINE MICROSCOPIC-ADD ON - Abnormal; Notable for the following:    Bacteria, UA FEW (*)    All other components within normal limits  COMPREHENSIVE METABOLIC PANEL - Abnormal; Notable for the following:    Glucose, Bld 115 (*)    ALT 55 (*)    Total Bilirubin 0.2 (*)    All other components within normal limits  CBC WITH DIFFERENTIAL   Imaging Review No results found.   EKG Interpretation None     MDM   Final diagnoses:  Renal colic on right side    I personally performed the services described in this documentation, which was scribed in my presence. The recorded information has been  reviewed and is accurate.  Patient treated symptomatically in the emergency department. We'll refill pain medication and advised follow-up with his urologist. I do believe imaging is necessary at this point. He's been given return precautions and has voice understanding.  Ricardo Raceravid Jentry Warnell, MD 01/07/14 [phone]

## 2014-01-06 NOTE — ED Notes (Signed)
Rt posterior flank pain starting 2 nights ago, radiating to R groin; mild pain in penis; Nausea, but no vomiting. Pt sts he has had large kidney stones recently, lithotripsy Oct 19th and most recently Monday Nov 9th. Sts he passes stone wed/thurs, has stones with him, pain got worse again Thurs evening. Pain now 7/10.

## 2014-01-06 NOTE — ED Notes (Signed)
Called pt back to room, pt is now vomiting, approx emesis in bag.

## 2014-01-07 MED ORDER — OXYCODONE-ACETAMINOPHEN 5-325 MG PO TABS: 2.0000 | ORAL_TABLET | ORAL | Status: DC | PRN

## 2014-01-07 MED ORDER — KETOROLAC TROMETHAMINE 10 MG PO TABS: 10.0000 mg | ORAL_TABLET | Freq: Four times a day (QID) | ORAL | Status: DC | PRN

## 2014-01-07 MED ORDER — KETOROLAC TROMETHAMINE 30 MG/ML IJ SOLN: 30.0000 mg | Freq: Once | INTRAMUSCULAR | Status: DC

## 2014-06-30 ENCOUNTER — Emergency Department (HOSPITAL_BASED_OUTPATIENT_CLINIC_OR_DEPARTMENT_OTHER)
Admission: EM | Admit: 2014-06-30 | Discharge: 2014-07-01 | Disposition: A | Discharge status: Home / Self Care | Payer: 59 | Attending: Emergency Medicine | Admitting: Emergency Medicine

## 2014-06-30 ENCOUNTER — Encounter (HOSPITAL_BASED_OUTPATIENT_CLINIC_OR_DEPARTMENT_OTHER): Payer: Self-pay | Admitting: Emergency Medicine

## 2014-06-30 DIAGNOSIS — R195 Other fecal abnormalities: Secondary | ICD-10-CM | POA: Insufficient documentation

## 2014-06-30 DIAGNOSIS — Z87442 Personal history of urinary calculi: Secondary | ICD-10-CM | POA: Diagnosis not present

## 2014-06-30 DIAGNOSIS — Z720 Tobacco use: Secondary | ICD-10-CM | POA: Insufficient documentation

## 2014-06-30 DIAGNOSIS — R112 Nausea with vomiting, unspecified: Secondary | ICD-10-CM | POA: Diagnosis not present

## 2014-06-30 DIAGNOSIS — R1030 Lower abdominal pain, unspecified: Secondary | ICD-10-CM

## 2014-06-30 DIAGNOSIS — Z79899 Other long term (current) drug therapy: Secondary | ICD-10-CM | POA: Insufficient documentation

## 2014-06-30 DIAGNOSIS — R109 Unspecified abdominal pain: Secondary | ICD-10-CM | POA: Diagnosis present

## 2014-06-30 LAB — CBC WITH DIFFERENTIAL/PLATELET
Basophils Absolute: 0 10*3/uL (ref 0.0–0.1)
Basophils Relative: 1 % (ref 0–1)
Eosinophils Absolute: 0.3 10*3/uL (ref 0.0–0.7)
Eosinophils Relative: 4 % (ref 0–5)
HCT: 45.7 % (ref 39.0–52.0)
Hemoglobin: 16.1 g/dL (ref 13.0–17.0)
Lymphocytes Relative: 24 % (ref 12–46)
Lymphs Abs: 1.7 10*3/uL (ref 0.7–4.0)
MCH: 29.5 pg (ref 26.0–34.0)
MCHC: 35.2 g/dL (ref 30.0–36.0)
MCV: 83.7 fL (ref 78.0–100.0)
Monocytes Absolute: 0.7 10*3/uL (ref 0.1–1.0)
Monocytes Relative: 9 % (ref 3–12)
Neutro Abs: 4.5 10*3/uL (ref 1.7–7.7)
Neutrophils Relative %: 62 % (ref 43–77)
Platelets: 379 10*3/uL (ref 150–400)
RBC: 5.46 MIL/uL (ref 4.22–5.81)
RDW: 12.8 % (ref 11.5–15.5)
WBC: 7.2 10*3/uL (ref 4.0–10.5)

## 2014-06-30 LAB — COMPREHENSIVE METABOLIC PANEL
ALT: 49 U/L (ref 17–63)
AST: 28 U/L (ref 15–41)
Albumin: 4 g/dL (ref 3.5–5.0)
Alkaline Phosphatase: 110 U/L (ref 38–126)
Anion gap: 8 (ref 5–15)
BUN: 13 mg/dL (ref 6–20)
CO2: 27 mmol/L (ref 22–32)
Calcium: 9.1 mg/dL (ref 8.9–10.3)
Chloride: 102 mmol/L (ref 101–111)
Creatinine, Ser: 0.88 mg/dL (ref 0.61–1.24)
GFR calc Af Amer: >60 mL/min (ref >60)
GFR calc non Af Amer: >60 mL/min (ref >60)
Glucose, Bld: 96 mg/dL (ref 70–99)
Potassium: 4.1 mmol/L (ref 3.5–5.1)
Sodium: 137 mmol/L (ref 135–145)
Total Bilirubin: 0.4 mg/dL (ref 0.3–1.2)
Total Protein: 7.3 g/dL (ref 6.5–8.1)

## 2014-06-30 LAB — URINALYSIS, ROUTINE W REFLEX MICROSCOPIC
Bilirubin Urine: NEGATIVE
Glucose, UA: NEGATIVE mg/dL
Hgb urine dipstick: NEGATIVE
Ketones, ur: NEGATIVE mg/dL
Nitrite: NEGATIVE
Protein, ur: NEGATIVE mg/dL
Specific Gravity, Urine: 1.021 (ref 1.005–1.030)
Urobilinogen, UA: 1 mg/dL (ref 0.0–1.0)
pH: 7.5 (ref 5.0–8.0)

## 2014-06-30 LAB — URINE MICROSCOPIC-ADD ON

## 2014-06-30 LAB — LIPASE, BLOOD: Lipase: 25 U/L (ref 22–51)

## 2014-06-30 MED ORDER — OXYCODONE-ACETAMINOPHEN 5-325 MG PO TABS: 1.0000 | ORAL_TABLET | ORAL | Status: DC | PRN

## 2014-06-30 MED ORDER — ONDANSETRON HCL 4 MG PO TABS: 4.0000 mg | ORAL_TABLET | Freq: Four times a day (QID) | ORAL | Status: DC

## 2014-06-30 MED ORDER — HYDROMORPHONE HCL 1 MG/ML IJ SOLN
1.0000 mg | Freq: Once | INTRAMUSCULAR | Status: AC
  Administered 2014-06-30: 1 mg via INTRAVENOUS
  Filled 2014-06-30: qty 1

## 2014-06-30 MED ORDER — SODIUM CHLORIDE 0.9 % IV BOLUS (SEPSIS)
1000.0000 mL | Freq: Once | INTRAVENOUS | Status: AC
  Administered 2014-06-30: 1000 mL via INTRAVENOUS

## 2014-06-30 MED ORDER — ONDANSETRON HCL 4 MG/2ML IJ SOLN
4.0000 mg | Freq: Once | INTRAMUSCULAR | Status: AC
  Administered 2014-06-30: 4 mg via INTRAVENOUS
  Filled 2014-06-30: qty 2

## 2014-06-30 NOTE — ED Notes (Signed)
Pt reports that he is scheduled for endoscopy in [place] this Friday d/t possible polyps seen on CT [facility].

## 2014-06-30 NOTE — ED Provider Notes (Signed)
CSN: [id number]     Arrival date & time 06/30/14  1959 History  This chart was scribed for Ricardo Sweeney Domenick Quebedeaux, MD by Abel PrestoKara Demonbreun, ED Scribe. This patient was seen in room MH11/MH11 and the patient's care was started at 8:49 PM.    Chief Complaint  Patient presents with  . Abdominal Pain     Patient is a 24 y.o. male presenting with abdominal pain. The history is provided by the patient. No language interpreter was used.  Abdominal Pain Associated symptoms: nausea and vomiting   Associated symptoms: no diarrhea, no dysuria, no fever and no hematuria    HPI Comments: Ricardo Sweeney is a 24 y.o. male who presents to the Emergency Department complaining of intermittent abdominal pain and right testicular pain with onset 2 months ago. He has had several Ct scan done to assess the problem with last scan done 2 weeks ago. He reports scan showed "2 black spots in intestine" shown. Pt states he is scheduled for endoscopy for 07/06/14. Pt notes associated nausea, testicular pain and vomiting with onset at 6 PM this evening. Pt reports abdominal pain is worse this episode and denies vomiting and testicular pain as previous symptoms. Pt denies diarrhea tonight but states he has been given medication to clean out his system inducing loose bowels. He reports hematochezia but states this is not new to this episode. Pt with h/o nephrolithiasis but states this does not feel similar. Pt denies fever, flank pain, scrotal swelling, sores, hematuria, dysuria and difficulty urinating.  Past Medical History  Diagnosis Date  . Kidney stone   . Kidney stone    Past Surgical History  Procedure Laterality Date  . Lithotripsy     History reviewed. No pertinent family history. History  Substance Use Topics  . Smoking status: Current Every Day Smoker    Types: Cigarettes  . Smokeless tobacco: Never Used  . Alcohol Use: No    Review of Systems  Constitutional: Negative for fever.  Gastrointestinal: Positive for  nausea, vomiting, abdominal pain and blood in stool. Negative for diarrhea.  Genitourinary: Negative for dysuria, hematuria, flank pain, scrotal swelling, difficulty urinating and genital sores.  All other systems reviewed and are negative.     Allergies  Review of patient's allergies indicates no known allergies.  Home Medications   Prior to Admission medications   Medication Sig Start Date End Date Taking? Authorizing Provider  potassium citrate (UROCIT-K) 10 MEQ (1080 MG) SR tablet Take 15 mEq by mouth 3 (three) times daily with meals.   Yes Historical Provider, MD  HYDROmorphone (DILAUDID) 4 MG tablet Take 1 tablet (4 mg total) by mouth every 4 (four) hours as needed for pain. 04/19/12   John Molpus, MD  ketorolac (TORADOL) 10 MG tablet Take 1 tablet (10 mg total) by mouth every 6 (six) hours as needed. 01/07/14   Loren Raceravid Yelverton, MD  ondansetron (ZOFRAN ODT) 8 MG disintegrating tablet Take 1 tablet (8 mg total) by mouth every 8 (eight) hours as needed for nausea. 04/19/12   John Molpus, MD  ondansetron (ZOFRAN) 4 MG tablet Take 1 tablet (4 mg total) by mouth every 6 (six) hours. 02/08/12   Rolan BuccoMelanie Belfi, MD  ondansetron (ZOFRAN) 4 MG tablet Take 1 tablet (4 mg total) by mouth every 8 (eight) hours as needed for nausea or vomiting. 11/14/13   Mirian MoMatthew Gentry, MD  oxyCODONE-acetaminophen (PERCOCET/ROXICET) 5-325 MG per tablet Take 2 tablets by mouth every 4 (four) hours as needed for severe pain. 01/07/14  Loren Raceravid Yelverton, MD  promethazine (PHENERGAN) 25 MG tablet Take 1 tablet (25 mg total) by mouth every 6 (six) hours as needed for nausea. 10/19/11 11/14/13  Rolan BuccoMelanie Belfi, MD  tamsulosin (FLOMAX) 0.4 MG CAPS capsule Take 1 capsule (0.4 mg total) by mouth daily. 11/14/13   Mirian MoMatthew Gentry, MD   BP 130/77 mmHg  Pulse 98  Temp(Src) 98.5 F (36.9 C) (Oral)  Resp 16  Ht 5\' 9"  (1.753 m)  Wt 250 lb (113.399 kg)  BMI 36.90 kg/m2  SpO2 98% Physical Exam  Constitutional: He appears  well-developed and well-nourished. No distress.  HENT:  Head: Normocephalic and atraumatic.  Eyes: Conjunctivae are normal. Right eye exhibits no discharge. Left eye exhibits no discharge.  Neck: Neck supple.  Cardiovascular: Normal rate, regular rhythm and normal heart sounds.  Exam reveals no gallop and no friction rub.   No murmur heard. Pulmonary/Chest: Effort normal and breath sounds normal. No respiratory distress.  Abdominal: Soft. He exhibits no distension. There is tenderness (lower abdominal). There is no rebound and no guarding. Hernia confirmed negative in the right inguinal area and confirmed negative in the left inguinal area.  Genitourinary: Testes normal and penis normal. Cremasteric reflex is present. Right testis shows no mass, no swelling and no tenderness. Cremasteric reflex is not absent on the right side. Left testis shows no mass, no swelling and no tenderness. Cremasteric reflex is not absent on the left side.  Musculoskeletal: He exhibits no edema or tenderness.  Lymphadenopathy:       Right: No inguinal adenopathy present.  Neurological: He is alert.  Skin: Skin is warm and dry.  Psychiatric: He has a normal mood and affect. His behavior is normal. Thought content normal.  Nursing note and vitals reviewed.   ED Course  Procedures (including critical care time) DIAGNOSTIC STUDIES: Oxygen Saturation is 98% on room air, normal by my interpretation.    COORDINATION OF CARE: 8:57 PM Discussed treatment plan with patient at beside, the patient agrees with the plan and has no further questions at this time.   Labs Review Labs Reviewed  URINALYSIS, ROUTINE W REFLEX MICROSCOPIC - Abnormal; Notable for the following:    APPearance TURBID (*)    Leukocytes, UA SMALL (*)    All other components within normal limits  COMPREHENSIVE METABOLIC PANEL  LIPASE, BLOOD  CBC WITH DIFFERENTIAL/PLATELET  URINE MICROSCOPIC-ADD ON    Imaging Review No results found.   EKG  Interpretation None      MDM   Final diagnoses:  Lower abdominal pain    I personally preformed the services scribed in my presence. The recorded information has been reviewed is accurate. Ricardo Sweeney Danniell Rotundo, MD.     Ricardo Sweeney Elaysia Devargas, MD 07/03/14 [phone]

## 2014-06-30 NOTE — ED Notes (Signed)
Patient reports around 1800 this evening he began vomiting and states he threw up "half a trashbag of vomit".  Reports pain to lower abdomen and pain to right testicle.  Reports previous CT scans have "shown something on my intestines".  Last CT approx 2 weeks ago per pt.  Reports he is scheduled to have endoscopy on Friday for further evaluation.

## 2014-06-30 NOTE — Discharge Instructions (Signed)

## 2014-09-04 ENCOUNTER — Emergency Department (HOSPITAL_BASED_OUTPATIENT_CLINIC_OR_DEPARTMENT_OTHER): Payer: 59

## 2014-09-04 ENCOUNTER — Encounter (HOSPITAL_BASED_OUTPATIENT_CLINIC_OR_DEPARTMENT_OTHER): Payer: Self-pay

## 2014-09-04 ENCOUNTER — Emergency Department (HOSPITAL_BASED_OUTPATIENT_CLINIC_OR_DEPARTMENT_OTHER)
Admission: EM | Admit: 2014-09-04 | Discharge: 2014-09-04 | Disposition: A | Discharge status: Home / Self Care | Payer: 59 | Attending: Emergency Medicine | Admitting: Emergency Medicine

## 2014-09-04 DIAGNOSIS — R1011 Right upper quadrant pain: Secondary | ICD-10-CM | POA: Diagnosis not present

## 2014-09-04 DIAGNOSIS — R1013 Epigastric pain: Secondary | ICD-10-CM | POA: Diagnosis present

## 2014-09-04 DIAGNOSIS — R112 Nausea with vomiting, unspecified: Secondary | ICD-10-CM | POA: Diagnosis not present

## 2014-09-04 DIAGNOSIS — R197 Diarrhea, unspecified: Secondary | ICD-10-CM | POA: Insufficient documentation

## 2014-09-04 DIAGNOSIS — Z87442 Personal history of urinary calculi: Secondary | ICD-10-CM | POA: Insufficient documentation

## 2014-09-04 DIAGNOSIS — Z8719 Personal history of other diseases of the digestive system: Secondary | ICD-10-CM | POA: Diagnosis not present

## 2014-09-04 DIAGNOSIS — Z79899 Other long term (current) drug therapy: Secondary | ICD-10-CM | POA: Diagnosis not present

## 2014-09-04 HISTORY — DX: Gastric ulcer, unspecified as acute or chronic, without hemorrhage or perforation: K25.9

## 2014-09-04 LAB — URINALYSIS, ROUTINE W REFLEX MICROSCOPIC
Bilirubin Urine: NEGATIVE
Glucose, UA: NEGATIVE mg/dL
Hgb urine dipstick: NEGATIVE
KETONES UR: NEGATIVE mg/dL
Leukocytes, UA: NEGATIVE
Nitrite: NEGATIVE
Protein, ur: NEGATIVE mg/dL
Specific Gravity, Urine: 1.018 (ref 1.005–1.030)
UROBILINOGEN UA: 0.2 mg/dL (ref 0.0–1.0)
pH: 8.5 — ABNORMAL HIGH (ref 5.0–8.0)

## 2014-09-04 LAB — COMPREHENSIVE METABOLIC PANEL
ALT: 81 U/L — AB (ref 17–63)
AST: 43 U/L — AB (ref 15–41)
Albumin: 4 g/dL (ref 3.5–5.0)
Alkaline Phosphatase: 108 U/L (ref 38–126)
Anion gap: 7 (ref 5–15)
BUN: 11 mg/dL (ref 6–20)
CALCIUM: 8.8 mg/dL — AB (ref 8.9–10.3)
CHLORIDE: 102 mmol/L (ref 101–111)
CO2: 28 mmol/L (ref 22–32)
Creatinine, Ser: 0.85 mg/dL (ref 0.61–1.24)
GFR calc Af Amer: >60 mL/min (ref >60)
GFR calc non Af Amer: >60 mL/min (ref >60)
Glucose, Bld: 96 mg/dL (ref 65–99)
Potassium: 4.2 mmol/L (ref 3.5–5.1)
SODIUM: 137 mmol/L (ref 135–145)
Total Bilirubin: 0.6 mg/dL (ref 0.3–1.2)
Total Protein: 7.2 g/dL (ref 6.5–8.1)

## 2014-09-04 LAB — LIPASE, BLOOD: LIPASE: 16 U/L — AB (ref 22–51)

## 2014-09-04 LAB — CBC WITH DIFFERENTIAL/PLATELET
Basophils Absolute: 0.1 10*3/uL (ref 0.0–0.1)
Basophils Relative: 1 % (ref 0–1)
EOS PCT: 4 % (ref 0–5)
Eosinophils Absolute: 0.2 10*3/uL (ref 0.0–0.7)
HCT: 50.2 % (ref 39.0–52.0)
Hemoglobin: 17.2 g/dL — ABNORMAL HIGH (ref 13.0–17.0)
LYMPHS PCT: 24 % (ref 12–46)
Lymphs Abs: 1.4 10*3/uL (ref 0.7–4.0)
MCH: 28.9 pg (ref 26.0–34.0)
MCHC: 34.3 g/dL (ref 30.0–36.0)
MCV: 84.4 fL (ref 78.0–100.0)
MONOS PCT: 11 % (ref 3–12)
Monocytes Absolute: 0.6 10*3/uL (ref 0.1–1.0)
NEUTROS ABS: 3.6 10*3/uL (ref 1.7–7.7)
Neutrophils Relative %: 60 % (ref 43–77)
Platelets: 302 10*3/uL (ref 150–400)
RBC: 5.95 MIL/uL — ABNORMAL HIGH (ref 4.22–5.81)
RDW: 12.4 % (ref 11.5–15.5)
WBC: 5.9 10*3/uL (ref 4.0–10.5)

## 2014-09-04 MED ORDER — ONDANSETRON HCL 4 MG/2ML IJ SOLN
4.0000 mg | Freq: Once | INTRAMUSCULAR | Status: AC
  Administered 2014-09-04: 4 mg via INTRAVENOUS
  Filled 2014-09-04: qty 2

## 2014-09-04 MED ORDER — RANITIDINE HCL 150 MG PO TABS: 150.0000 mg | ORAL_TABLET | Freq: Two times a day (BID) | ORAL | Status: DC

## 2014-09-04 MED ORDER — GI COCKTAIL ~~LOC~~
30.0000 mL | Freq: Once | ORAL | Status: AC
  Administered 2014-09-04: 30 mL via ORAL
  Filled 2014-09-04: qty 30

## 2014-09-04 MED ORDER — PROMETHAZINE HCL 25 MG PO TABS: 25.0000 mg | ORAL_TABLET | Freq: Four times a day (QID) | ORAL | Status: DC | PRN

## 2014-09-04 MED ORDER — SODIUM CHLORIDE 0.9 % IV BOLUS (SEPSIS)
1000.0000 mL | Freq: Once | INTRAVENOUS | Status: AC
  Administered 2014-09-04: 1000 mL via INTRAVENOUS

## 2014-09-04 NOTE — Discharge Instructions (Signed)

## 2014-09-04 NOTE — ED Notes (Signed)
Patient transported to Ultrasound 

## 2014-09-04 NOTE — ED Provider Notes (Signed)
CSN: [id number]     Arrival date & time 09/04/14  1214 History   First MD Initiated Contact with Patient 09/04/14 1223     Chief Complaint  Patient presents with  . Abdominal Pain     (Consider location/radiation/quality/duration/timing/severity/associated sxs/prior Treatment) Patient is a 24 y.o. male presenting with abdominal pain.  Abdominal Pain Pain location:  Epigastric Pain quality: aching   Pain radiates to:  RUQ Pain severity:  Severe Onset quality:  Gradual Duration:  4 days Timing:  Constant Progression:  Worsening Chronicity:  Recurrent Context: retching   Relieved by:  Nothing Exacerbated by: vomiting. eating. Ineffective treatments: "Zofran suppositories" Associated symptoms: diarrhea, nausea and vomiting   Associated symptoms: no fever     Past Medical History  Diagnosis Date  . Kidney stone   . Kidney stone   . Multiple gastric ulcers    Past Surgical History  Procedure Laterality Date  . Lithotripsy     No family history on file. History  Substance Use Topics  . Smoking status: Former Smoker    Quit date: 08/05/2014  . Smokeless tobacco: Never Used  . Alcohol Use: No    Review of Systems  Constitutional: Negative for fever.  Gastrointestinal: Positive for nausea, vomiting, abdominal pain and diarrhea.  All other systems reviewed and are negative.     Allergies  Review of patient's allergies indicates no known allergies.  Home Medications   Prior to Admission medications   Medication Sig Start Date End Date Taking? Authorizing Provider  DICYCLOMINE HCL PO Take by mouth.   Yes Historical Provider, MD  potassium citrate (UROCIT-K) 10 MEQ (1080 MG) SR tablet Take 10 mEq by mouth 3 (three) times daily with meals.   Yes Historical Provider, MD  UNKNOWN TO PATIENT Med for ulcers   Yes Historical Provider, MD  ondansetron (ZOFRAN ODT) 8 MG disintegrating tablet Take 1 tablet (8 mg total) by mouth every 8 (eight) hours as needed for nausea.  04/19/12   Okley Magnussen Molpus, MD  ondansetron (ZOFRAN) 4 MG tablet Take 1 tablet (4 mg total) by mouth every 6 (six) hours. 02/08/12   Rolan BuccoMelanie Belfi, MD  ondansetron (ZOFRAN) 4 MG tablet Take 1 tablet (4 mg total) by mouth every 8 (eight) hours as needed for nausea or vomiting. 11/14/13   Mirian MoMatthew Gentry, MD  ondansetron (ZOFRAN) 4 MG tablet Take 1 tablet (4 mg total) by mouth every 6 (six) hours. 06/30/14   Raeford RazorStephen Kohut, MD  promethazine (PHENERGAN) 25 MG tablet Take 1 tablet (25 mg total) by mouth every 6 (six) hours as needed for nausea. 10/19/11 11/14/13  Rolan BuccoMelanie Belfi, MD  tamsulosin (FLOMAX) 0.4 MG CAPS capsule Take 1 capsule (0.4 mg total) by mouth daily. 11/14/13   Mirian MoMatthew Gentry, MD   Pulse 98  Temp(Src) 98.3 F (36.8 C) (Oral)  Resp 16  Ht 5\' 9"  (1.753 m)  Wt 230 lb (104.327 kg)  BMI 33.95 kg/m2  SpO2 100% Physical Exam  Constitutional: He is oriented to person, place, and time. He appears well-developed and well-nourished. No distress.  HENT:  Head: Normocephalic and atraumatic.  Eyes: Conjunctivae are normal. No scleral icterus.  Neck: Neck supple.  Cardiovascular: Normal rate and intact distal pulses.   Pulmonary/Chest: Effort normal. No stridor. No respiratory distress.  Abdominal: Normal appearance. He exhibits no distension. There is tenderness in the right upper quadrant and epigastric area. There is no rigidity, no rebound and no guarding. Hernia confirmed negative in the right inguinal area and confirmed negative  in the left inguinal area.  Genitourinary: Right testis shows no mass and no tenderness. Left testis shows no mass and no tenderness.  Neurological: He is alert and oriented to person, place, and time.  Skin: Skin is warm and dry. No rash noted.  Psychiatric: He has a normal mood and affect. His behavior is normal.  Nursing note and vitals reviewed.   ED Course  Procedures (including critical care time) Labs Review Labs Reviewed  CBC WITH DIFFERENTIAL/PLATELET -  Abnormal; Notable for the following:    RBC 5.95 (*)    Hemoglobin 17.2 (*)    All other components within normal limits  COMPREHENSIVE METABOLIC PANEL - Abnormal; Notable for the following:    Calcium 8.8 (*)    AST 43 (*)    ALT 81 (*)    All other components within normal limits  URINALYSIS, ROUTINE W REFLEX MICROSCOPIC (NOT AT [facility]) - Abnormal; Notable for the following:    pH 8.5 (*)    All other components within normal limits  LIPASE, BLOOD - Abnormal; Notable for the following:    Lipase 16 (*)    All other components within normal limits    Imaging Review US Abdomen Complete  09/04/2014   CLINICAL DATA:  Acute right upper quadrant abdominal pain.  EXAM: ULTRASOUND ABDOMEN COMPLETE  COMPARISON:  CT scan of August 15, 2014.  Ultrasound of August 06, 2014.  FINDINGS: Gallbladder: No gallstones or wall thickening visualized. No sonographic Murphy sign noted. 3 mm non shadowing echogenic focus is noted along gallbladder wall most consistent with small polyp.  Common bile duct: Diameter: 2 mm which is within normal limits.  Liver: Increased echogenicity of hepatic parenchyma is noted suggesting fatty infiltration. Hypoechoic area measuring 1.7 x 1.5 cm is noted adjacent to gallbladder fossa most consistent with fatty sparing.  IVC: Visualized portion appears normal.  Pancreas: Visualized portion unremarkable.  Spleen: Size and appearance within normal limits.  Right Kidney: Length: 10.2 cm. Two probable nonobstructive calculi are noted, each measuring approximately 5 mm. Echogenicity within normal limits. No mass or hydronephrosis visualized.  Left Kidney: Length: 10.9 cm. Echogenicity within normal limits. No mass or hydronephrosis visualized.  Abdominal aorta: No aneurysm visualized.  Other findings: None.  IMPRESSION: Probable nonobstructive right nephrolithiasis. No hydronephrosis or renal obstruction is noted.  Increased echogenicity of hepatic parenchyma is noted suggesting fatty  infiltration. Probable fatty sparing is noted adjacent to gallbladder fossa.  Probable 3 mm gallbladder polyp is noted.   Electronically Signed   By: Lupita Raider, M.D.   On: 09/04/2014 14:34     EKG Interpretation None      MDM   Final diagnoses:  Epigastric abdominal pain    24 yo male with epigastric abdominal pain, radiates to RUQ.  Labs unremarkable.  US showed nephrolithiasis without apparent obstruction.  Also shows fatty liver (pt reports occasional heavy drinking - I encouraged him to quit).  No cholecystitis.  Plan dc with GI follow up.      Blake Divine, MD 09/04/14 1524

## 2014-09-04 NOTE — ED Notes (Signed)
C/o abd pain, n/v/d x approx-was [facility] last week and by PCP yesterday

## 2015-08-17 ENCOUNTER — Encounter (HOSPITAL_BASED_OUTPATIENT_CLINIC_OR_DEPARTMENT_OTHER): Payer: Self-pay | Admitting: Emergency Medicine

## 2015-08-17 ENCOUNTER — Emergency Department (HOSPITAL_BASED_OUTPATIENT_CLINIC_OR_DEPARTMENT_OTHER)
Admission: EM | Admit: 2015-08-17 | Discharge: 2015-08-18 | Disposition: A | Discharge status: Home / Self Care | Payer: Medicaid - Out of State | Attending: Emergency Medicine | Admitting: Emergency Medicine

## 2015-08-17 DIAGNOSIS — Z87891 Personal history of nicotine dependence: Secondary | ICD-10-CM | POA: Insufficient documentation

## 2015-08-17 DIAGNOSIS — R52 Pain, unspecified: Secondary | ICD-10-CM

## 2015-08-17 DIAGNOSIS — R109 Unspecified abdominal pain: Secondary | ICD-10-CM

## 2015-08-17 DIAGNOSIS — L089 Local infection of the skin and subcutaneous tissue, unspecified: Secondary | ICD-10-CM | POA: Diagnosis not present

## 2015-08-17 DIAGNOSIS — R1030 Lower abdominal pain, unspecified: Secondary | ICD-10-CM | POA: Diagnosis present

## 2015-08-17 DIAGNOSIS — R110 Nausea: Secondary | ICD-10-CM | POA: Insufficient documentation

## 2015-08-17 LAB — CBC
HEMATOCRIT: 44.3 % (ref 39.0–52.0)
HEMOGLOBIN: 15.6 g/dL (ref 13.0–17.0)
MCH: 29.5 pg (ref 26.0–34.0)
MCHC: 35.2 g/dL (ref 30.0–36.0)
MCV: 83.9 fL (ref 78.0–100.0)
Platelets: 289 10*3/uL (ref 150–400)
RBC: 5.28 MIL/uL (ref 4.22–5.81)
RDW: 12.4 % (ref 11.5–15.5)
WBC: 7.9 10*3/uL (ref 4.0–10.5)

## 2015-08-17 NOTE — ED Provider Notes (Signed)
CSN: [id number]     Arrival date & time 08/17/15  2036 History  By signing my name below, I, [name], attest that this documentation has been prepared under the direction and in the presence of Hayzen Lorenson, MD . Electronically Signed: [name], Scribe. 08/18/2015. 12:06 AM.   Chief Complaint  Patient presents with  . Abdominal Pain    Patient is a 25 y.o. male presenting with abdominal pain. The history is provided by the patient. No language interpreter was used.  Abdominal Pain Pain severity:  Moderate Onset quality:  Gradual Timing:  Constant Progression:  Unchanged Context: not medication withdrawal and not sick contacts   Relieved by:  Nothing Worsened by:  Nothing tried Ineffective treatments:  None tried Associated symptoms: dysuria and nausea   Associated symptoms: no fever, no hematuria and no vomiting   Risk factors: no alcohol abuse    HPI Comments:  Ricardo Sweeney is a 25 y.o. male with PMHx of gastric ulcers and kidney stone who presents to the Emergency Department complaining of sharp, stabbing, constant lower abdominal pain onset last night. Pt reports he stuck a q-tip in his belly button earlier today which produced blood. Pt states he "took a spill" last night, causing sharp pain from his lower abdomen down to right testicle; he reports subsequent nausea. He has had 3 soft bowel movements today. No alleviating factors noted. Pt also notes chronic dysuria. Denies recent abdominal surgeries, hematuria, fever, back pain, new rashes, trauma to abdomen, or recent antibiotic use.   Past Medical History  Diagnosis Date  . Kidney stone   . Kidney stone   . Multiple gastric ulcers    Past Surgical History  Procedure Laterality Date  . Lithotripsy     History reviewed. No pertinent family history. Social History  Substance Use Topics  . Smoking status: Former Smoker    Quit date: 08/05/2014  . Smokeless tobacco: Never Used  . Alcohol Use: No     Review of Systems  Constitutional: Negative for fever.  Gastrointestinal: Positive for nausea and abdominal pain. Negative for vomiting.  Genitourinary: Positive for dysuria. Negative for hematuria.  Musculoskeletal: Negative for back pain.  Skin: Negative for rash.  All other systems reviewed and are negative.   Allergies  Ultram  Home Medications   Prior to Admission medications   Medication Sig Start Date End Date Taking? Authorizing Provider  DICYCLOMINE HCL PO Take by mouth.    Historical Provider, MD  ondansetron (ZOFRAN ODT) 8 MG disintegrating tablet Take 1 tablet (8 mg total) by mouth every 8 (eight) hours as needed for nausea. 04/19/12   John Molpus, MD  ondansetron (ZOFRAN) 4 MG tablet Take 1 tablet (4 mg total) by mouth every 6 (six) hours. 02/08/12   Rolan Bucco, MD  ondansetron (ZOFRAN) 4 MG tablet Take 1 tablet (4 mg total) by mouth every 8 (eight) hours as needed for nausea or vomiting. 11/14/13   Mirian Mo, MD  ondansetron (ZOFRAN) 4 MG tablet Take 1 tablet (4 mg total) by mouth every 6 (six) hours. 06/30/14   Raeford Razor, MD  potassium citrate (UROCIT-K) 10 MEQ (1080 MG) SR tablet Take 10 mEq by mouth 3 (three) times daily with meals.    Historical Provider, MD  promethazine (PHENERGAN) 25 MG tablet Take 1 tablet (25 mg total) by mouth every 6 (six) hours as needed for nausea or vomiting. 09/04/14   Blake Divine, MD  ranitidine (ZANTAC) 150 MG tablet Take 1 tablet (150 mg  total) by mouth 2 (two) times daily. 09/04/14   Blake DivineJohn Wofford, MD  tamsulosin (FLOMAX) 0.4 MG CAPS capsule Take 1 capsule (0.4 mg total) by mouth daily. 11/14/13   Mirian MoMatthew Gentry, MD  UNKNOWN TO PATIENT Med for ulcers    Historical Provider, MD   BP 117/81 mmHg  Pulse 100  Temp(Src) 98.9 F (37.2 C) (Oral)  Resp 18  Ht 5\' 9"  (1.753 m)  Wt 215 lb (97.523 kg)  BMI 31.74 kg/m2  SpO2 100%   Physical Exam  Constitutional: He is oriented to person, place, and time. He appears well-developed  and well-nourished. No distress.  HENT:  Head: Normocephalic and atraumatic.  Mouth/Throat: Oropharynx is clear and moist. No oropharyngeal exudate.  Eyes: EOM are normal. Pupils are equal, round, and reactive to light.  Neck: Normal range of motion. Neck supple.  Cardiovascular: Normal rate, regular rhythm, normal heart sounds and intact distal pulses.   Pulmonary/Chest: Effort normal and breath sounds normal. No respiratory distress. He has no wheezes. He has no rales.  Abdominal: Soft. Bowel sounds are increased. There is no tenderness. There is no rigidity, no rebound, no guarding, no tenderness at McBurney's point and negative Murphy's sign.  1cm deep belly button, no bleeding at this time.  No redness.  No palpable fistula  Musculoskeletal: Normal range of motion.  Neurological: He is alert and oriented to person, place, and time. He has normal reflexes.  Skin: Skin is warm and dry.  Psychiatric: He has a normal mood and affect.  Nursing note and vitals reviewed.   ED Course  Procedures  DIAGNOSTIC STUDIES:  Oxygen Saturation is 100% on RA, normal by my interpretation.    COORDINATION OF CARE:  12:01 AM Advised pt to apply Clotramazol cream to belly button for 1 week. Will order CT renal stone study and administer Toradol and Bentyl. Discussed treatment plan with pt at bedside and pt agreed to plan.  Labs Review Labs Reviewed  LIPASE, BLOOD  COMPREHENSIVE METABOLIC PANEL  CBC  URINALYSIS, ROUTINE W REFLEX MICROSCOPIC (NOT AT [facility])    Imaging Review Ct Renal Stone Study  08/18/2015  CLINICAL DATA:  Sharp umbilical pain for 2 days. Umbilical hemorrhage. History of kidney stones and lithotripsy. EXAM: CT ABDOMEN AND PELVIS WITHOUT CONTRAST TECHNIQUE: Multidetector CT imaging of the abdomen and pelvis was performed following the standard protocol without IV contrast. COMPARISON:  CT abdomen and pelvis August 15, 2014 FINDINGS: LUNG BASES: Included view of the lung bases are  clear. The visualized heart and pericardium are unremarkable. KIDNEYS/BLADDER: Kidneys are orthotopic, demonstrating normal size and morphology. Punctate RIGHT lower pole, punctate LEFT interpolar nephrolithiasis. No hydronephrosis; limited assessment for renal masses on this nonenhanced examination. The unopacified ureters are normal in course and caliber. Urinary bladder is partially distended and unremarkable. SOLID ORGANS: The liver, spleen, gallbladder, pancreas and adrenal glands are unremarkable for this non-contrast examination. GASTROINTESTINAL TRACT: Very small hiatal hernia. The stomach, small and large bowel are normal in course and caliber without inflammatory changes, the sensitivity may be decreased by lack of enteric contrast. Small cecal diverticulum versus hypoplastic appendix. PERITONEUM/RETROPERITONEUM: Aortoiliac vessels are normal in course and caliber. No lymphadenopathy by CT size criteria. Prostate size is normal. No intraperitoneal free fluid nor free air. SOFT TISSUES/ OSSEOUS STRUCTURES: Nonsuspicious. Slightly thickened appearance of the umbilical subcutaneous fat. No subcutaneous gas or radiopaque foreign bodies. No focal fluid collections. IMPRESSION: Mildly thickened umbilical soft tissues suggests focal cellulitis without abscess. Punctate bilateral nonobstructing nephrolithiasis. Electronically Signed  By: Awilda Metroourtnay  Bloomer M.D.   On: 08/18/2015 00:26   I have personally reviewed and evaluated these images and lab results as part of my medical decision-making.   EKG Interpretation None      MDM   Final diagnoses:  Pain   Filed Vitals:   08/17/15 2042  BP: 117/81  Pulse: 100  Temp: 98.9 F (37.2 C)  Resp: 18   Results for orders placed or performed during the hospital encounter of 08/17/15  Lipase, blood  Result Value Ref Range   Lipase 16 11 - 51 U/L  Comprehensive metabolic panel  Result Value Ref Range   Sodium 136 135 - 145 mmol/L   Potassium 4.0 3.5  - 5.1 mmol/L   Chloride 103 101 - 111 mmol/L   CO2 25 22 - 32 mmol/L   Glucose, Bld 94 65 - 99 mg/dL   BUN 14 6 - 20 mg/dL   Creatinine, Ser 1.610.91 0.61 - 1.24 mg/dL   Calcium 8.9 8.9 - 09.610.3 mg/dL   Total Protein 7.2 6.5 - 8.1 g/dL   Albumin 3.9 3.5 - 5.0 g/dL   AST 29 15 - 41 U/L   ALT 45 17 - 63 U/L   Alkaline Phosphatase 97 38 - 126 U/L   Total Bilirubin 0.4 0.3 - 1.2 mg/dL   GFR calc non Af Amer >60 >60 mL/min   GFR calc Af Amer >60 >60 mL/min   Anion gap 8 5 - 15  CBC  Result Value Ref Range   WBC 7.9 4.0 - 10.5 K/uL   RBC 5.28 4.22 - 5.81 MIL/uL   Hemoglobin 15.6 13.0 - 17.0 g/dL   HCT 04.544.3 40.939.0 - 81.152.0 %   MCV 83.9 78.0 - 100.0 fL   MCH 29.5 26.0 - 34.0 pg   MCHC 35.2 30.0 - 36.0 g/dL   RDW 91.412.4 78.211.5 - 95.615.5 %   Platelets 289 150 - 400 K/uL  Urinalysis, Routine w reflex microscopic  Result Value Ref Range   Color, Urine YELLOW YELLOW   APPearance CLEAR CLEAR   Specific Gravity, Urine 1.022 1.005 - 1.030   pH 6.0 5.0 - 8.0   Glucose, UA NEGATIVE NEGATIVE mg/dL   Hgb urine dipstick NEGATIVE NEGATIVE   Bilirubin Urine NEGATIVE NEGATIVE   Ketones, ur NEGATIVE NEGATIVE mg/dL   Protein, ur NEGATIVE NEGATIVE mg/dL   Nitrite NEGATIVE NEGATIVE   Leukocytes, UA NEGATIVE NEGATIVE   Ct Renal Stone Study  08/18/2015  CLINICAL DATA:  Sharp umbilical pain for 2 days. Umbilical hemorrhage. History of kidney stones and lithotripsy. EXAM: CT ABDOMEN AND PELVIS WITHOUT CONTRAST TECHNIQUE: Multidetector CT imaging of the abdomen and pelvis was performed following the standard protocol without IV contrast. COMPARISON:  CT abdomen and pelvis August 15, 2014 FINDINGS: LUNG BASES: Included view of the lung bases are clear. The visualized heart and pericardium are unremarkable. KIDNEYS/BLADDER: Kidneys are orthotopic, demonstrating normal size and morphology. Punctate RIGHT lower pole, punctate LEFT interpolar nephrolithiasis. No hydronephrosis; limited assessment for renal masses on this  nonenhanced examination. The unopacified ureters are normal in course and caliber. Urinary bladder is partially distended and unremarkable. SOLID ORGANS: The liver, spleen, gallbladder, pancreas and adrenal glands are unremarkable for this non-contrast examination. GASTROINTESTINAL TRACT: Very small hiatal hernia. The stomach, small and large bowel are normal in course and caliber without inflammatory changes, the sensitivity may be decreased by lack of enteric contrast. Small cecal diverticulum versus hypoplastic appendix. PERITONEUM/RETROPERITONEUM: Aortoiliac vessels are normal in course and caliber.  No lymphadenopathy by CT size criteria. Prostate size is normal. No intraperitoneal free fluid nor free air. SOFT TISSUES/ OSSEOUS STRUCTURES: Nonsuspicious. Slightly thickened appearance of the umbilical subcutaneous fat. No subcutaneous gas or radiopaque foreign bodies. No focal fluid collections. IMPRESSION: Mildly thickened umbilical soft tissues suggests focal cellulitis without abscess. Punctate bilateral nonobstructing nephrolithiasis. Electronically Signed   By: Awilda Metro M.D.   On: 08/18/2015 00:26     Medications  ketorolac (TORADOL) 30 MG/ML injection 30 mg (30 mg Intravenous Given 08/18/15 0017)  dicyclomine (BENTYL) injection 20 mg (20 mg Intramuscular Given 08/18/15 0043)  sulfamethoxazole-trimethoprim (BACTRIM DS,SEPTRA DS) 800-160 MG per tablet 1 tablet (1 tablet Oral Given 08/18/15 0249)   Felt better post medication in the ED  Antibiotics orally, for the infection in the umbilicus.  Follow up with your PMD for recheck.   Strict return precautions given   I personally performed the services described in this documentation, which was scribed in my presence. The recorded information has been reviewed and is accurate.      Cy Blamer, MD 08/19/15 [phone]

## 2015-08-17 NOTE — ED Notes (Signed)
Patient states that he has had sharp pains to his navel x 2 days. Today "i stuck my finger in there and nothing but blood came out". No blood noted at navel

## 2015-08-18 ENCOUNTER — Emergency Department (HOSPITAL_BASED_OUTPATIENT_CLINIC_OR_DEPARTMENT_OTHER): Payer: Medicaid - Out of State

## 2015-08-18 LAB — URINALYSIS, ROUTINE W REFLEX MICROSCOPIC
BILIRUBIN URINE: NEGATIVE
Glucose, UA: NEGATIVE mg/dL
HGB URINE DIPSTICK: NEGATIVE
Ketones, ur: NEGATIVE mg/dL
Leukocytes, UA: NEGATIVE
Nitrite: NEGATIVE
Protein, ur: NEGATIVE mg/dL
Specific Gravity, Urine: 1.022 (ref 1.005–1.030)
pH: 6 (ref 5.0–8.0)

## 2015-08-18 LAB — COMPREHENSIVE METABOLIC PANEL
ALK PHOS: 97 U/L (ref 38–126)
ALT: 45 U/L (ref 17–63)
AST: 29 U/L (ref 15–41)
Albumin: 3.9 g/dL (ref 3.5–5.0)
Anion gap: 8 (ref 5–15)
BILIRUBIN TOTAL: 0.4 mg/dL (ref 0.3–1.2)
BUN: 14 mg/dL (ref 6–20)
CALCIUM: 8.9 mg/dL (ref 8.9–10.3)
CO2: 25 mmol/L (ref 22–32)
CREATININE: 0.91 mg/dL (ref 0.61–1.24)
Chloride: 103 mmol/L (ref 101–111)
GFR calc Af Amer: >60 mL/min (ref >60)
Glucose, Bld: 94 mg/dL (ref 65–99)
Potassium: 4 mmol/L (ref 3.5–5.1)
Sodium: 136 mmol/L (ref 135–145)
TOTAL PROTEIN: 7.2 g/dL (ref 6.5–8.1)

## 2015-08-18 LAB — LIPASE, BLOOD: Lipase: 16 U/L (ref 11–51)

## 2015-08-18 MED ORDER — DICYCLOMINE HCL 20 MG PO TABS: 20.0000 mg | ORAL_TABLET | Freq: Two times a day (BID) | ORAL | Status: DC

## 2015-08-18 MED ORDER — KETOROLAC TROMETHAMINE 30 MG/ML IJ SOLN
30.0000 mg | Freq: Once | INTRAMUSCULAR | Status: AC
  Administered 2015-08-18: 30 mg via INTRAVENOUS
  Filled 2015-08-18: qty 1

## 2015-08-18 MED ORDER — OMEPRAZOLE 20 MG PO CPDR: 20.0000 mg | DELAYED_RELEASE_CAPSULE | Freq: Every day | ORAL | Status: DC

## 2015-08-18 MED ORDER — SULFAMETHOXAZOLE-TRIMETHOPRIM 800-160 MG PO TABS
1.0000 | ORAL_TABLET | Freq: Two times a day (BID) | ORAL | Status: AC
Start: 2015-08-18 — End: 2015-08-25

## 2015-08-18 MED ORDER — SULFAMETHOXAZOLE-TRIMETHOPRIM 800-160 MG PO TABS
1.0000 | ORAL_TABLET | Freq: Once | ORAL | Status: AC
  Administered 2015-08-18: 1 via ORAL
  Filled 2015-08-18: qty 1

## 2015-08-18 MED ORDER — DICYCLOMINE HCL 10 MG/ML IM SOLN
20.0000 mg | Freq: Once | INTRAMUSCULAR | Status: AC
  Administered 2015-08-18: 20 mg via INTRAMUSCULAR
  Filled 2015-08-18: qty 2

## 2015-08-19 ENCOUNTER — Encounter (HOSPITAL_BASED_OUTPATIENT_CLINIC_OR_DEPARTMENT_OTHER): Payer: Self-pay | Admitting: Emergency Medicine

## 2015-09-06 ENCOUNTER — Emergency Department (HOSPITAL_BASED_OUTPATIENT_CLINIC_OR_DEPARTMENT_OTHER): Payer: Medicaid - Out of State

## 2015-09-06 ENCOUNTER — Emergency Department (HOSPITAL_BASED_OUTPATIENT_CLINIC_OR_DEPARTMENT_OTHER)
Admission: EM | Admit: 2015-09-06 | Discharge: 2015-09-06 | Disposition: A | Discharge status: Home / Self Care | Payer: Medicaid - Out of State | Attending: Emergency Medicine | Admitting: Emergency Medicine

## 2015-09-06 ENCOUNTER — Encounter (HOSPITAL_BASED_OUTPATIENT_CLINIC_OR_DEPARTMENT_OTHER): Payer: Self-pay | Admitting: *Deleted

## 2015-09-06 DIAGNOSIS — K625 Hemorrhage of anus and rectum: Secondary | ICD-10-CM

## 2015-09-06 DIAGNOSIS — Z87891 Personal history of nicotine dependence: Secondary | ICD-10-CM | POA: Diagnosis not present

## 2015-09-06 DIAGNOSIS — R1033 Periumbilical pain: Secondary | ICD-10-CM

## 2015-09-06 LAB — COMPREHENSIVE METABOLIC PANEL
ALT: 61 U/L (ref 17–63)
ANION GAP: 8 (ref 5–15)
AST: 43 U/L — ABNORMAL HIGH (ref 15–41)
Albumin: 4.1 g/dL (ref 3.5–5.0)
Alkaline Phosphatase: 85 U/L (ref 38–126)
BILIRUBIN TOTAL: 0.4 mg/dL (ref 0.3–1.2)
BUN: 19 mg/dL (ref 6–20)
CHLORIDE: 105 mmol/L (ref 101–111)
CO2: 25 mmol/L (ref 22–32)
Calcium: 8.6 mg/dL — ABNORMAL LOW (ref 8.9–10.3)
Creatinine, Ser: 0.98 mg/dL (ref 0.61–1.24)
Glucose, Bld: 107 mg/dL — ABNORMAL HIGH (ref 65–99)
POTASSIUM: 4 mmol/L (ref 3.5–5.1)
Sodium: 138 mmol/L (ref 135–145)
TOTAL PROTEIN: 7.1 g/dL (ref 6.5–8.1)

## 2015-09-06 LAB — CBC
HCT: 45.1 % (ref 39.0–52.0)
HEMOGLOBIN: 15.9 g/dL (ref 13.0–17.0)
MCH: 30.1 pg (ref 26.0–34.0)
MCHC: 35.3 g/dL (ref 30.0–36.0)
MCV: 85.3 fL (ref 78.0–100.0)
Platelets: 307 10*3/uL (ref 150–400)
RBC: 5.29 MIL/uL (ref 4.22–5.81)
RDW: 13.1 % (ref 11.5–15.5)
WBC: 5.8 10*3/uL (ref 4.0–10.5)

## 2015-09-06 LAB — LIPASE, BLOOD: LIPASE: 22 U/L (ref 11–51)

## 2015-09-06 MED ORDER — PROMETHAZINE HCL 25 MG PO TABS: 25.0000 mg | ORAL_TABLET | Freq: Four times a day (QID) | ORAL | Status: DC | PRN

## 2015-09-06 MED ORDER — HYDROMORPHONE HCL 1 MG/ML IJ SOLN
1.0000 mg | Freq: Once | INTRAMUSCULAR | Status: AC
  Administered 2015-09-06: 1 mg via INTRAVENOUS
  Filled 2015-09-06: qty 1

## 2015-09-06 MED ORDER — SODIUM CHLORIDE 0.9 % IV BOLUS (SEPSIS)
500.0000 mL | Freq: Once | INTRAVENOUS | Status: AC
Start: 2015-09-06 — End: 2015-09-06
  Administered 2015-09-06: 500 mL via INTRAVENOUS

## 2015-09-06 MED ORDER — IOPAMIDOL (ISOVUE-300) INJECTION 61%
100.0000 mL | Freq: Once | INTRAVENOUS | Status: AC | PRN
  Administered 2015-09-06: 100 mL via INTRAVENOUS

## 2015-09-06 MED ORDER — SODIUM CHLORIDE 0.9 % IV SOLN
INTRAVENOUS | Status: DC
  Administered 2015-09-06: 17:00:00 via INTRAVENOUS

## 2015-09-06 MED ORDER — ONDANSETRON HCL 4 MG/2ML IJ SOLN
4.0000 mg | Freq: Once | INTRAMUSCULAR | Status: AC
  Administered 2015-09-06: 4 mg via INTRAVENOUS
  Filled 2015-09-06: qty 2

## 2015-09-06 MED ORDER — HYDROMORPHONE HCL 4 MG PO TABS: 4.0000 mg | ORAL_TABLET | Freq: Four times a day (QID) | ORAL | Status: DC | PRN

## 2015-09-06 NOTE — ED Notes (Signed)
Abdominal pain. Sharp pain. Bright red rectal blood in his BM after the sharp pain.

## 2015-09-06 NOTE — ED Provider Notes (Signed)
CSN: [id number]     Arrival date & time 09/06/15  1440 History   First MD Initiated Contact with Patient 09/06/15 1554     Chief Complaint  Patient presents with  . Abdominal Pain     (Consider location/radiation/quality/duration/timing/severity/associated sxs/prior Treatment) Patient is a 25 y.o. male presenting with abdominal pain. The history is provided by the patient.  Abdominal Pain Associated symptoms: no chest pain, no diarrhea, no dysuria, no fever, no nausea, no shortness of breath and no vomiting   Patient periumbilical abdominal pain, patient has been evaluated for this in the past with renal CT without any acute findings. No nausea no vomiting no diarrhea. Patient recently started on antibiotics for urinary tract infection. Patient states her was one episode of red blood per rectum today. No other bloody bowel movements. Patient states abdominal pain is sharp periumbilical 8 out of 10. Also has some left flank left CVA back pain.  Past Medical History  Diagnosis Date  . Kidney stone   . Kidney stone   . Multiple gastric ulcers    Past Surgical History  Procedure Laterality Date  . Lithotripsy     No family history on file. Social History  Substance Use Topics  . Smoking status: Former Smoker    Quit date: 08/05/2014  . Smokeless tobacco: Never Used  . Alcohol Use: No    Review of Systems  Constitutional: Negative for fever.  HENT: Negative for congestion.   Eyes: Negative for visual disturbance.  Respiratory: Negative for shortness of breath.   Cardiovascular: Negative for chest pain.  Gastrointestinal: Positive for abdominal pain and blood in stool. Negative for nausea, vomiting and diarrhea.  Genitourinary: Negative for dysuria.  Musculoskeletal: Positive for back pain.  Skin: Negative for rash.  Neurological: Negative for headaches.  Hematological: Does not bruise/bleed easily.  Psychiatric/Behavioral: Negative for confusion.      Allergies   Ultram  Home Medications   Prior to Admission medications   Medication Sig Start Date End Date Taking? Authorizing Provider  dicyclomine (BENTYL) 20 MG tablet Take 1 tablet (20 mg total) by mouth 2 (two) times daily. 08/18/15   April Palumbo, MD  DICYCLOMINE HCL PO Take by mouth.    Historical Provider, MD  HYDROmorphone (DILAUDID) 4 MG tablet Take 1 tablet (4 mg total) by mouth every 6 (six) hours as needed for severe pain. 09/06/15   Vanetta MuldersScott Alizon Schmeling, MD  omeprazole (PRILOSEC) 20 MG capsule Take 1 capsule (20 mg total) by mouth daily. 08/18/15   April Palumbo, MD  ondansetron (ZOFRAN ODT) 8 MG disintegrating tablet Take 1 tablet (8 mg total) by mouth every 8 (eight) hours as needed for nausea. 04/19/12   John Molpus, MD  ondansetron (ZOFRAN) 4 MG tablet Take 1 tablet (4 mg total) by mouth every 6 (six) hours. 02/08/12   Rolan BuccoMelanie Belfi, MD  ondansetron (ZOFRAN) 4 MG tablet Take 1 tablet (4 mg total) by mouth every 8 (eight) hours as needed for nausea or vomiting. 11/14/13   Mirian MoMatthew Gentry, MD  ondansetron (ZOFRAN) 4 MG tablet Take 1 tablet (4 mg total) by mouth every 6 (six) hours. 06/30/14   Raeford RazorStephen Kohut, MD  potassium citrate (UROCIT-K) 10 MEQ (1080 MG) SR tablet Take 10 mEq by mouth 3 (three) times daily with meals.    Historical Provider, MD  promethazine (PHENERGAN) 25 MG tablet Take 1 tablet (25 mg total) by mouth every 6 (six) hours as needed for nausea or vomiting. 09/04/14   Blake DivineJohn Wofford, MD  promethazine (PHENERGAN) 25 MG tablet Take 1 tablet (25 mg total) by mouth every 6 (six) hours as needed for nausea or vomiting. 09/06/15   Vanetta Mulders, MD  ranitidine (ZANTAC) 150 MG tablet Take 1 tablet (150 mg total) by mouth 2 (two) times daily. 09/04/14   Blake Divine, MD  tamsulosin (FLOMAX) 0.4 MG CAPS capsule Take 1 capsule (0.4 mg total) by mouth daily. 11/14/13   Mirian Mo, MD  UNKNOWN TO PATIENT Med for ulcers    Historical Provider, MD   BP 112/68 mmHg  Pulse 90  Temp(Src) 98 F (36.7  C) (Oral)  Resp 18  Ht  (1.753 m)  Wt 102.059 kg  BMI 33.21 kg/m2  SpO2 97% Physical Exam  Constitutional: He is oriented to person, place, and time. He appears well-developed and well-nourished.  HENT:  Head: Normocephalic and atraumatic.  Eyes: Conjunctivae and EOM are normal. Pupils are equal, round, and reactive to light.  Neck: Normal range of motion. Neck supple.  Cardiovascular: Normal rate, regular rhythm and normal heart sounds.   Pulmonary/Chest: Effort normal and breath sounds normal. No respiratory distress.  Abdominal: Soft. Bowel sounds are normal. There is tenderness.  Mild tenderness periumbilical area no guarding  Genitourinary:  Rectal exam without any gross blood. No stool in vault. No external hemorrhoids or prolapsed internal hemorrhoids.  Musculoskeletal: Normal range of motion. He exhibits no edema.  Neurological: He is alert and oriented to person, place, and time. No cranial nerve deficit. He exhibits normal muscle tone. Coordination normal.  Skin: Skin is warm. No rash noted. No erythema.  Nursing note and vitals reviewed.   ED Course  Procedures (including critical care time) Labs Review Labs Reviewed  COMPREHENSIVE METABOLIC PANEL - Abnormal; Notable for the following:    Glucose, Bld 107 (*)    Calcium 8.6 (*)    AST 43 (*)    All other components within normal limits  LIPASE, BLOOD  CBC  URINALYSIS, ROUTINE W REFLEX MICROSCOPIC (NOT AT [facility])   Results for orders placed or performed during the hospital encounter of 09/06/15  Lipase, blood  Result Value Ref Range   Lipase 22 11 - 51 U/L  Comprehensive metabolic panel  Result Value Ref Range   Sodium 138 135 - 145 mmol/L   Potassium 4.0 3.5 - 5.1 mmol/L   Chloride 105 101 - 111 mmol/L   CO2 25 22 - 32 mmol/L   Glucose, Bld 107 (H) 65 - 99 mg/dL   BUN 19 6 - 20 mg/dL   Creatinine, Ser 1.61 0.61 - 1.24 mg/dL   Calcium 8.6 (L) 8.9 - 10.3 mg/dL   Total Protein 7.1 6.5 - 8.1 g/dL    Albumin 4.1 3.5 - 5.0 g/dL   AST 43 (H) 15 - 41 U/L   ALT 61 17 - 63 U/L   Alkaline Phosphatase 85 38 - 126 U/L   Total Bilirubin 0.4 0.3 - 1.2 mg/dL   GFR calc non Af Amer >60 >60 mL/min   GFR calc Af Amer >60 >60 mL/min   Anion gap 8 5 - 15  CBC  Result Value Ref Range   WBC 5.8 4.0 - 10.5 K/uL   RBC 5.29 4.22 - 5.81 MIL/uL   Hemoglobin 15.9 13.0 - 17.0 g/dL   HCT 09.6 04.5 - 40.9 %   MCV 85.3 78.0 - 100.0 fL   MCH 30.1 26.0 - 34.0 pg   MCHC 35.3 30.0 - 36.0 g/dL   RDW 81.1 91.4 -  15.5 %   Platelets 307 150 - 400 K/uL     Imaging Review Ct Abdomen Pelvis W Contrast  09/06/2015  CLINICAL DATA:  Acute periumbilical abdominal pain. Bright red blood per rectum. EXAM: CT ABDOMEN AND PELVIS WITH CONTRAST TECHNIQUE: Multidetector CT imaging of the abdomen and pelvis was performed using the standard protocol following bolus administration of intravenous contrast. CONTRAST:  ISOVUE-300 IOPAMIDOL (ISOVUE-300) INJECTION 61% COMPARISON:  CT scan of August 18, 2015. FINDINGS: Visualized lung bases are unremarkable. No significant osseous abnormality is noted. No gallstones are noted. The liver, spleen and pancreas are unremarkable. Adrenal glands appear normal. Small nonobstructive calculus is noted in lower pole collecting system of right kidney. No hydronephrosis or renal obstruction is noted. No ureteral calculi are noted. There is no evidence of bowel obstruction. No abnormal fluid collection is noted. Urinary bladder appears normal. The appendix is not well visualized. However, no inflammation is noted in the right lower quadrant. IMPRESSION: Small nonobstructive right renal calculus. No hydronephrosis or renal obstruction is noted. No other abnormality seen in the abdomen or pelvis. Electronically Signed   By: Lupita Raider, M.D.   On: 09/06/2015 18:21   I have personally reviewed and evaluated these images and lab results as part of my medical decision-making.   EKG  Interpretation None      MDM   Final diagnoses:  Periumbilical abdominal pain  Rectal bleeding    Workup for the periumbilical abdominal pain without any sniffing findings. Workup for the rectal bleeding without any significant findings. Hemoglobin and hematocrits normal. No gross blood on rectal exam. Recommend follow-up with GI medicine for colonoscopy and possible upper endoscopy. Patient pain improved significantly with hydromorphone IV we'll continue that orally. Patient remains hemodynamically stable. Patient currently being treated for urinary tract infection. Patient did not want to wait for repeat urinalysis here.   Vanetta Mulders, MD 09/06/15 2014

## 2015-09-06 NOTE — Discharge Instructions (Signed)
Take the pain medication and nausea medicine as directed. Today's workup without any significant abnormalities. However strongly recommend that you follow-up with GI medicine for upper endoscopy and colonoscopy. Return for any recurrent red blood from the rectum that stains that commode water all red 3 times a day.

## 2015-09-12 ENCOUNTER — Encounter (HOSPITAL_BASED_OUTPATIENT_CLINIC_OR_DEPARTMENT_OTHER): Payer: Self-pay

## 2015-09-12 ENCOUNTER — Emergency Department (HOSPITAL_BASED_OUTPATIENT_CLINIC_OR_DEPARTMENT_OTHER): Payer: Medicaid - Out of State

## 2015-09-12 ENCOUNTER — Emergency Department (HOSPITAL_BASED_OUTPATIENT_CLINIC_OR_DEPARTMENT_OTHER)
Admission: EM | Admit: 2015-09-12 | Discharge: 2015-09-12 | Disposition: A | Discharge status: Home / Self Care | Payer: Medicaid - Out of State | Attending: Emergency Medicine | Admitting: Emergency Medicine

## 2015-09-12 DIAGNOSIS — R110 Nausea: Secondary | ICD-10-CM | POA: Diagnosis not present

## 2015-09-12 DIAGNOSIS — R1012 Left upper quadrant pain: Secondary | ICD-10-CM | POA: Diagnosis not present

## 2015-09-12 DIAGNOSIS — Z87891 Personal history of nicotine dependence: Secondary | ICD-10-CM | POA: Diagnosis not present

## 2015-09-12 DIAGNOSIS — R109 Unspecified abdominal pain: Secondary | ICD-10-CM

## 2015-09-12 HISTORY — DX: Other bacterial infections of unspecified site: A49.8

## 2015-09-12 LAB — CBC WITH DIFFERENTIAL/PLATELET
BASOS PCT: 1 %
Basophils Absolute: 0 10*3/uL (ref 0.0–0.1)
Eosinophils Absolute: 0.2 10*3/uL (ref 0.0–0.7)
Eosinophils Relative: 3 %
HEMATOCRIT: 48.6 % (ref 39.0–52.0)
HEMOGLOBIN: 17.5 g/dL — AB (ref 13.0–17.0)
LYMPHS ABS: 1.8 10*3/uL (ref 0.7–4.0)
LYMPHS PCT: 23 %
MCH: 30 pg (ref 26.0–34.0)
MCHC: 36 g/dL (ref 30.0–36.0)
MCV: 83.4 fL (ref 78.0–100.0)
MONO ABS: 0.7 10*3/uL (ref 0.1–1.0)
MONOS PCT: 8 %
NEUTROS ABS: 5.2 10*3/uL (ref 1.7–7.7)
NEUTROS PCT: 65 %
Platelets: 347 10*3/uL (ref 150–400)
RBC: 5.83 MIL/uL — ABNORMAL HIGH (ref 4.22–5.81)
RDW: 13.1 % (ref 11.5–15.5)
WBC: 8 10*3/uL (ref 4.0–10.5)

## 2015-09-12 LAB — COMPREHENSIVE METABOLIC PANEL
ALBUMIN: 4.3 g/dL (ref 3.5–5.0)
ALK PHOS: 100 U/L (ref 38–126)
ALT: 61 U/L (ref 17–63)
ANION GAP: 8 (ref 5–15)
AST: 37 U/L (ref 15–41)
BILIRUBIN TOTAL: 0.6 mg/dL (ref 0.3–1.2)
BUN: 17 mg/dL (ref 6–20)
CALCIUM: 9.4 mg/dL (ref 8.9–10.3)
CO2: 25 mmol/L (ref 22–32)
Chloride: 105 mmol/L (ref 101–111)
Creatinine, Ser: 0.88 mg/dL (ref 0.61–1.24)
GFR calc non Af Amer: >60 mL/min (ref >60)
Glucose, Bld: 96 mg/dL (ref 65–99)
Potassium: 4.5 mmol/L (ref 3.5–5.1)
Sodium: 138 mmol/L (ref 135–145)
TOTAL PROTEIN: 7.9 g/dL (ref 6.5–8.1)

## 2015-09-12 LAB — LIPASE, BLOOD: LIPASE: 20 U/L (ref 11–51)

## 2015-09-12 MED ORDER — SUCRALFATE 1 GM/10ML PO SUSP: 1.0000 g | Freq: Three times a day (TID) | ORAL | Status: DC

## 2015-09-12 MED ORDER — HYDROMORPHONE HCL 1 MG/ML IJ SOLN
1.0000 mg | Freq: Once | INTRAMUSCULAR | Status: AC
  Administered 2015-09-12: 1 mg via INTRAVENOUS
  Filled 2015-09-12: qty 1

## 2015-09-12 MED ORDER — PANTOPRAZOLE SODIUM 20 MG PO TBEC: 20.0000 mg | DELAYED_RELEASE_TABLET | Freq: Two times a day (BID) | ORAL | Status: DC

## 2015-09-12 MED ORDER — PROMETHAZINE HCL 25 MG/ML IJ SOLN
12.5000 mg | Freq: Once | INTRAMUSCULAR | Status: AC
  Administered 2015-09-12: 12.5 mg via INTRAVENOUS
  Filled 2015-09-12: qty 1

## 2015-09-12 MED ORDER — GI COCKTAIL ~~LOC~~
30.0000 mL | Freq: Once | ORAL | Status: AC
  Administered 2015-09-12: 30 mL via ORAL
  Filled 2015-09-12: qty 30

## 2015-09-12 MED ORDER — SODIUM CHLORIDE 0.9 % IV BOLUS (SEPSIS)
1000.0000 mL | Freq: Once | INTRAVENOUS | Status: AC
  Administered 2015-09-12: 1000 mL via INTRAVENOUS

## 2015-09-12 NOTE — ED Provider Notes (Signed)
CSN: [id number]     Arrival date & time 09/12/15  1325 History   First MD Initiated Contact with Patient 09/12/15 1359     Chief Complaint  Patient presents with  . Abdominal Pain     (Consider location/radiation/quality/duration/timing/severity/associated sxs/prior Treatment) Patient is a 25 y.o. male presenting with abdominal pain.  Abdominal Pain Pain location:  LUQ Pain quality: aching and sharp   Pain radiates to:  Back Pain severity:  Mild Onset quality:  Sudden Timing:  Intermittent Chronicity:  Recurrent Context: not alcohol use, not diet changes, not eating, not recent illness, not retching and not sick contacts   Associated symptoms: nausea   Associated symptoms: no constipation and no vomiting     Past Medical History  Diagnosis Date  . Kidney stone   . Kidney stone   . Multiple gastric ulcers   . Clostridium difficile infection    Past Surgical History  Procedure Laterality Date  . Lithotripsy    . Colonoscopy    . Kidney stone stent     No family history on file. Social History  Substance Use Topics  . Smoking status: Former Smoker    Quit date: 08/05/2014  . Smokeless tobacco: Never Used  . Alcohol Use: No    Review of Systems  Gastrointestinal: Positive for nausea, abdominal pain and blood in stool (intermittently). Negative for vomiting and constipation.  All other systems reviewed and are negative.     Allergies  Ultram  Home Medications   Prior to Admission medications   Medication Sig Start Date End Date Taking? Authorizing Provider  HYDROmorphone (DILAUDID) 4 MG tablet Take 1 tablet (4 mg total) by mouth every 6 (six) hours as needed for severe pain. 09/06/15   Vanetta Mulders, MD  ondansetron (ZOFRAN) 4 MG tablet Take 1 tablet (4 mg total) by mouth every 6 (six) hours. 06/30/14   Raeford Razor, MD  pantoprazole (PROTONIX) 20 MG tablet Take 1 tablet (20 mg total) by mouth 2 (two) times daily. 09/12/15   Marily Memos, MD  promethazine  (PHENERGAN) 25 MG tablet Take 1 tablet (25 mg total) by mouth every 6 (six) hours as needed for nausea or vomiting. 09/04/14   Blake Divine, MD  promethazine (PHENERGAN) 25 MG tablet Take 1 tablet (25 mg total) by mouth every 6 (six) hours as needed for nausea or vomiting. 09/06/15   Vanetta Mulders, MD  ranitidine (ZANTAC) 150 MG tablet Take 1 tablet (150 mg total) by mouth 2 (two) times daily. 09/04/14   Blake Divine, MD  sucralfate (CARAFATE) 1 GM/10ML suspension Take 10 mLs (1 g total) by mouth 4 (four) times daily -  with meals and at bedtime. 09/12/15   Marily Memos, MD   BP 113/91 mmHg  Pulse 97  Temp(Src) 98.3 F (36.8 C) (Oral)  Resp 18  Ht  (1.753 m)  Wt 253 lb (114.76 kg)  BMI 37.34 kg/m2  SpO2 95% Physical Exam  Constitutional: He is oriented to person, place, and time. He appears well-developed and well-nourished.  HENT:  Head: Normocephalic and atraumatic.  Eyes: Conjunctivae are normal. Pupils are equal, round, and reactive to light.  Neck: Normal range of motion.  Cardiovascular: Normal rate.   Pulmonary/Chest: Effort normal. No respiratory distress.  Abdominal: Soft. He exhibits no distension. There is no tenderness.  Musculoskeletal: Normal range of motion. He exhibits no edema or tenderness.  Neurological: He is alert and oriented to person, place, and time.  Skin: Skin is warm and dry.  Nursing note and vitals reviewed.   ED Course  Procedures (including critical care time) Labs Review Labs Reviewed  CBC WITH DIFFERENTIAL/PLATELET - Abnormal; Notable for the following:    RBC 5.83 (*)    Hemoglobin 17.5 (*)    All other components within normal limits  COMPREHENSIVE METABOLIC PANEL  LIPASE, BLOOD  H. PYLORI ANTIBODY, IGG    Imaging Review Dg Abd Acute W/chest  09/12/2015  CLINICAL DATA:  Abdominal pain and flank pain with nausea, vomiting and diarrhea 1 week. History of kidney stones. EXAM: DG ABDOMEN ACUTE W/ 1V CHEST COMPARISON:  CT 09/06/2015 and  abdominal films 08/28/2015, acute abdominal series 07/29/2014 FINDINGS: Lungs are adequately inflated without consolidation or effusion. Cardiomediastinal silhouette and remainder of the chest is unchanged. Supine erect abdominal films demonstrate no free peritoneal air. Bowel gas pattern is nonobstructive with paucity of bowel gas over the lower abdomen. Few air-fluid levels over the right colon. No mass or mass effect. Remaining bones and soft tissues are within normal. IMPRESSION: Nonobstructive bowel gas pattern. No acute cardiopulmonary disease. Electronically Signed   By: Elberta Fortisaniel  Boyle M.D.   On: 09/12/2015 14:41   I have personally reviewed and evaluated these images and lab results as part of my medical decision-making.   EKG Interpretation None      MDM   Final diagnoses:  Abdominal pain, unspecified abdominal location   Acute exacerbation of chronic abdominal pain. Abdomen benign. Suspect gastritis v ulcer. Has been taking a lot f ibuprofen to try and relieve the pain. Will eval with labs. Ct done last week and negative. Has GI follow up next week.   Labs and imaging unremarkable. Suspect likely gastritis. Heart rate improved with fluids and pain medicine.   Of note, the patient's wife revealed to the nurse while the patient was alert and she is concerned about her husband's narcotic-seeking behavior. Reassured her that he would not be receiving prescription for narcotic pain medications at this time however if she thought he had a problem with addiction she would need to follow-up with [facility] or something similar.  New Prescriptions: New Prescriptions   PANTOPRAZOLE (PROTONIX) 20 MG TABLET    Take 1 tablet (20 mg total) by mouth 2 (two) times daily.   SUCRALFATE (CARAFATE) 1 GM/10ML SUSPENSION    Take 10 mLs (1 g total) by mouth 4 (four) times daily -  with meals and at bedtime.     I have personally and contemperaneously reviewed labs and imaging and used in my decision making  as above.   A medical screening exam was performed and I feel the patient has had an appropriate workup for their chief complaint at this time and likelihood of emergent condition existing is low and thus workup can continue on an outpatient basis.. Their vital signs are stable. They have been counseled on decision, discharge, follow up and which symptoms necessitate immediate return to the emergency department.  They verbally stated understanding and agreement with plan and discharged in stable condition.      Marily MemosJason Annaliyah Willig, MD 09/12/15 [phone]

## 2015-09-12 NOTE — ED Notes (Signed)
MD at bedside. 

## 2015-09-12 NOTE — ED Notes (Signed)
C/o abd pain-n/v/d x 1 week-seen here for same last week-GI appt next week-NAD-steady gait

## 2015-09-13 LAB — H. PYLORI ANTIBODY, IGG

## 2015-10-12 ENCOUNTER — Emergency Department (HOSPITAL_BASED_OUTPATIENT_CLINIC_OR_DEPARTMENT_OTHER)
Admission: EM | Admit: 2015-10-12 | Discharge: 2015-10-12 | Disposition: A | Discharge status: Home / Self Care | Payer: Managed Care, Other (non HMO) | Attending: Emergency Medicine | Admitting: Emergency Medicine

## 2015-10-12 ENCOUNTER — Encounter (HOSPITAL_BASED_OUTPATIENT_CLINIC_OR_DEPARTMENT_OTHER): Payer: Self-pay | Admitting: Emergency Medicine

## 2015-10-12 ENCOUNTER — Emergency Department (HOSPITAL_BASED_OUTPATIENT_CLINIC_OR_DEPARTMENT_OTHER): Payer: Managed Care, Other (non HMO)

## 2015-10-12 DIAGNOSIS — Z87891 Personal history of nicotine dependence: Secondary | ICD-10-CM | POA: Insufficient documentation

## 2015-10-12 DIAGNOSIS — L600 Ingrowing nail: Secondary | ICD-10-CM

## 2015-10-12 MED ORDER — HYDROCODONE-ACETAMINOPHEN 5-325 MG PO TABS
1.0000 | ORAL_TABLET | Freq: Once | ORAL | Status: AC
  Administered 2015-10-12: 1 via ORAL
  Filled 2015-10-12: qty 1

## 2015-10-12 MED ORDER — BACITRACIN ZINC 500 UNIT/GM EX OINT
TOPICAL_OINTMENT | Freq: Two times a day (BID) | CUTANEOUS | Status: DC
  Administered 2015-10-12: 19:00:00 via TOPICAL
  Filled 2015-10-12: qty 28.35

## 2015-10-12 MED ORDER — NAPROXEN 500 MG PO TABS: 500.0000 mg | ORAL_TABLET | Freq: Two times a day (BID) | ORAL | 0 refills | Status: DC

## 2015-10-12 MED ORDER — CEPHALEXIN 500 MG PO CAPS: 500.0000 mg | ORAL_CAPSULE | Freq: Four times a day (QID) | ORAL | 0 refills | Status: DC

## 2015-10-12 MED ORDER — HYDROCODONE-IBUPROFEN 7.5-200 MG PO TABS: 1.0000 | ORAL_TABLET | Freq: Four times a day (QID) | ORAL | 0 refills | Status: DC | PRN

## 2015-10-12 MED ORDER — CEPHALEXIN 250 MG PO CAPS
500.0000 mg | ORAL_CAPSULE | Freq: Once | ORAL | Status: AC
  Administered 2015-10-12: 500 mg via ORAL
  Filled 2015-10-12: qty 2

## 2015-10-12 MED ORDER — LIDOCAINE HCL (PF) 1 % IJ SOLN
5.0000 mL | Freq: Once | INTRAMUSCULAR | Status: AC
  Administered 2015-10-12: 5 mL
  Filled 2015-10-12: qty 5

## 2015-10-12 MED ORDER — BUPIVACAINE HCL 0.25 % IJ SOLN
10.0000 mL | Freq: Once | INTRAMUSCULAR | Status: AC
  Administered 2015-10-12: 10 mL
  Filled 2015-10-12: qty 1

## 2015-10-12 NOTE — ED Provider Notes (Signed)
[facility] Provider Note   CSN: [id number] Arrival date & time: 10/12/15  [id number]  By signing my name below, I, Vista Minkobert Ross, attest that this documentation has been prepared under the direction and in the presence of [facility] Aika Brzoska NP.  Electronically Signed: Vista Minkobert Ross, ED Scribe. 10/12/15. 4:50 PM.   History   Chief Complaint Chief Complaint  Patient presents with  . Toe Pain    HPI HPI Comments: Jari PiggBrandon Theriault is a 25 y.o. male who presents to the Emergency Department complaining of pain to his left big toe that has been gradually worsening for the past three days. Pt states that he has been unable to wear shoes due to pain. Pt also reports that he is having difficulty ambulating and has been limping due to exacerbating pain. Pt further states the area has become red today. Pt has not taken any medications PTA.   The history is provided by the patient. No language interpreter was used.      Past Medical History:  Diagnosis Date  . Clostridium difficile infection   . Kidney stone   . Kidney stone   . Multiple gastric ulcers     There are no active problems to display for this patient.   Past Surgical History:  Procedure Laterality Date  . COLONOSCOPY    . kidney stone stent    . LITHOTRIPSY         Home Medications    Prior to Admission medications   Medication Sig Start Date End Date Taking? Authorizing Provider  cephALEXin (KEFLEX) 500 MG capsule Take 1 capsule (500 mg total) by mouth 4 (four) times daily. 10/12/15   Bambie Pizzolato Orlene OchM Cleotilde Spadaccini, NP  HYDROcodone-ibuprofen (VICOPROFEN) 7.5-200 MG tablet Take 1 tablet by mouth every 6 (six) hours as needed for moderate pain. 10/12/15   Henryetta Corriveau Orlene OchM Heavenleigh Petruzzi, NP  HYDROmorphone (DILAUDID) 4 MG tablet Take 1 tablet (4 mg total) by mouth every 6 (six) hours as needed for severe pain. 09/06/15   Vanetta MuldersScott Zackowski, MD  naproxen (NAPROSYN) 500 MG tablet Take 1 tablet (500 mg total) by mouth 2 (two) times daily. 10/12/15   Lenetta Piche Orlene OchM Ismael Karge, NP    ondansetron (ZOFRAN) 4 MG tablet Take 1 tablet (4 mg total) by mouth every 6 (six) hours. 06/30/14   Raeford RazorStephen Kohut, MD  pantoprazole (PROTONIX) 20 MG tablet Take 1 tablet (20 mg total) by mouth 2 (two) times daily. 09/12/15   Marily MemosJason Mesner, MD  promethazine (PHENERGAN) 25 MG tablet Take 1 tablet (25 mg total) by mouth every 6 (six) hours as needed for nausea or vomiting. 09/04/14   Blake DivineJohn Wofford, MD  promethazine (PHENERGAN) 25 MG tablet Take 1 tablet (25 mg total) by mouth every 6 (six) hours as needed for nausea or vomiting. 09/06/15   Vanetta MuldersScott Zackowski, MD  ranitidine (ZANTAC) 150 MG tablet Take 1 tablet (150 mg total) by mouth 2 (two) times daily. 09/04/14   Blake DivineJohn Wofford, MD  sucralfate (CARAFATE) 1 GM/10ML suspension Take 10 mLs (1 g total) by mouth 4 (four) times daily -  with meals and at bedtime. 09/12/15   Marily MemosJason Mesner, MD    Family History History reviewed. No pertinent family history.  Social History Social History  Substance Use Topics  . Smoking status: Former Smoker    Quit date: 08/05/2014  . Smokeless tobacco: Never Used  . Alcohol use No     Allergies   Ultram [tramadol]   Review of Systems Review of Systems  Musculoskeletal: Positive for  arthralgias (left big toe).  Skin: Positive for color change (erythema ).  Neurological: Negative for numbness.  All other systems reviewed and are negative.    Physical Exam Updated Vital Signs BP 129/93   Pulse 100   Temp 98.1 F (36.7 C) (Oral)   Resp 18   Ht 5\' 9"  (1.753 m)   Wt 114.3 kg   SpO2 100%   BMI 37.21 kg/m   Physical Exam  Constitutional: He is oriented to person, place, and time. He appears well-developed and well-nourished. No distress.  HENT:  Head: Normocephalic and atraumatic.  Neck: Normal range of motion.  Pulmonary/Chest: Effort normal.  Neurological: He is alert and oriented to person, place, and time.  Skin: Skin is warm and dry. He is not diaphoretic. There is erythema.  Tender, swollen left  great toe with erythema of the skin lateral near the nail consistent with infected ingrown toenail.  Psychiatric: He has a normal mood and affect. Judgment normal.  Nursing note and vitals reviewed.    ED Treatments / Results  DIAGNOSTIC STUDIES: Oxygen Saturation is 100% on RA, normal by my interpretation.  COORDINATION OF CARE: 4:35 PM-Will order imaging. Discussed treatment plan with pt at bedside and pt agreed to plan.   Labs (all labs ordered are listed, but only abnormal results are displayed) Labs Reviewed - No data to display  Radiology Dg Toe Great Left  Result Date: 10/12/2015 CLINICAL DATA:  Pain and swelling over the last 3 days. Possible infected toenail. EXAM: LEFT GREAT TOE COMPARISON:  12/14/2013 FINDINGS: No evidence of fracture, dislocation, arthritis, radiopaque foreign object or osteomyelitis. IMPRESSION: Negative. Electronically Signed   By: Paulina Fusi M.D.   On: 10/12/2015 17:19    Procedures .Nail Removal Date/Time: 10/12/2015 6:25 PM Performed by: Janne Napoleon Authorized by: Janne Napoleon   Consent:    Consent obtained:  Verbal   Consent given by:  Patient   Risks discussed:  Incomplete removal and infection   Alternatives discussed:  No treatment Location:    Foot:  L big toe Pre-procedure details:    Skin preparation:  Betadine   Preparation: Patient was prepped and draped in the usual sterile fashion   Anesthesia (see MAR for exact dosages):    Anesthesia method: digital block. Nail Removal:    Nail removed:  Partial   Nail side:  Lateral   Nail bed repaired: no   Ingrown nail:    Wedge excision of skin: yes   Post-procedure details:    Dressing:  Post-op shoe and antibiotic ointment   Patient tolerance of procedure:  Tolerated well, no immediate complications   (including critical care time)  Medications Ordered in ED Medications  bacitracin ointment ( Topical Given 10/12/15 1854)  bupivacaine (MARCAINE) 0.25 % (with pres) injection  10 mL (10 mLs Infiltration Given by Other 10/12/15 1708)  lidocaine (PF) (XYLOCAINE) 1 % injection 5 mL (5 mLs Infiltration Given by Other 10/12/15 1709)  HYDROcodone-acetaminophen (NORCO/VICODIN) 5-325 MG per tablet 1 tablet (1 tablet Oral Given 10/12/15 1853)  cephALEXin (KEFLEX) capsule 500 mg (500 mg Oral Given 10/12/15 1853)     Initial Impression / Assessment and Plan / ED Course  I have reviewed the triage vital signs and the nursing notes.  Clinical Course     Final Clinical Impressions(s) / ED Diagnoses  25 y.o. male with left great toe pain and redness stable for d/c without red streaking, fever and does not appear toxic. Will treat with antibiotics  and he will soak in warm water. He will f/u with his PCP or return for worsening symptoms.   Final diagnoses:  Ingrown left big toenail    New Prescriptions New Prescriptions   CEPHALEXIN (KEFLEX) 500 MG CAPSULE    Take 1 capsule (500 mg total) by mouth 4 (four) times daily.   HYDROCODONE-IBUPROFEN (VICOPROFEN) 7.5-200 MG TABLET    Take 1 tablet by mouth every 6 (six) hours as needed for moderate pain.   NAPROXEN (NAPROSYN) 500 MG TABLET    Take 1 tablet (500 mg total) by mouth 2 (two) times daily.   I personally performed the services described in this documentation, which was scribed in my presence. The recorded information has been reviewed and is accurate.    StewartvilleHope M Rilley Poulter, NP 10/12/15 1924    Nelva Nayobert Beaton, MD 10/13/15 [phone]

## 2015-10-12 NOTE — ED Triage Notes (Signed)
Patient reports that his left big toe has been hurting  - he is unable to wear normal shoes due to the pain

## 2015-10-12 NOTE — ED Notes (Signed)
Dressing applied to big toe.  Asks for more pain meds.  Will give new meds.

## 2015-11-08 ENCOUNTER — Encounter (HOSPITAL_BASED_OUTPATIENT_CLINIC_OR_DEPARTMENT_OTHER): Payer: Self-pay | Admitting: *Deleted

## 2015-11-08 ENCOUNTER — Emergency Department (HOSPITAL_BASED_OUTPATIENT_CLINIC_OR_DEPARTMENT_OTHER)
Admission: EM | Admit: 2015-11-08 | Discharge: 2015-11-08 | Disposition: A | Discharge status: Home / Self Care | Payer: Medicaid - Out of State | Attending: Emergency Medicine | Admitting: Emergency Medicine

## 2015-11-08 DIAGNOSIS — M25562 Pain in left knee: Secondary | ICD-10-CM | POA: Diagnosis not present

## 2015-11-08 DIAGNOSIS — Z87891 Personal history of nicotine dependence: Secondary | ICD-10-CM | POA: Insufficient documentation

## 2015-11-08 MED ORDER — ACETAMINOPHEN 500 MG PO TABS
1000.0000 mg | ORAL_TABLET | Freq: Once | ORAL | Status: AC
  Administered 2015-11-08: 1000 mg via ORAL
  Filled 2015-11-08: qty 2

## 2015-11-08 MED ORDER — KETOROLAC TROMETHAMINE 60 MG/2ML IM SOLN
60.0000 mg | Freq: Once | INTRAMUSCULAR | Status: AC
  Administered 2015-11-08: 60 mg via INTRAMUSCULAR
  Filled 2015-11-08: qty 2

## 2015-11-08 NOTE — ED Provider Notes (Signed)
[facility] Provider Note   CSN: [id number] Arrival date & time: 11/08/15  1709     History   Chief Complaint Chief Complaint  Patient presents with  . Knee Pain    HPI Ricardo Sweeney is a 25 y.o. male.  26 yo M with a chief complaint of left knee pain. This happened when the patient was climbing up a ladder. He felt like sudden sharp pain. Since then he's been having difficulty bearing weight on that leg. Denies any swelling some pain with range of motion. Denies fevers or chills. Denies prior injury to the knee. Denies trauma.   The history is provided by the patient and the spouse.  Knee Pain   This is a new problem. The current episode started 3 to 5 hours ago. The problem occurs constantly. The problem has not changed since onset.The pain is present in the left knee. The quality of the pain is described as sharp. The pain is at a severity of 7/10. The pain is severe. Pertinent negatives include no numbness and full range of motion. He has tried nothing for the symptoms. The treatment provided no relief. There has been no history of extremity trauma.    Past Medical History:  Diagnosis Date  . Clostridium difficile infection   . Kidney stone   . Kidney stone   . Multiple gastric ulcers     There are no active problems to display for this patient.   Past Surgical History:  Procedure Laterality Date  . COLONOSCOPY    . kidney stone stent    . LITHOTRIPSY         Home Medications    Prior to Admission medications   Medication Sig Start Date End Date Taking? Authorizing Provider  HYDROcodone-ibuprofen (VICOPROFEN) 7.5-200 MG tablet Take 1 tablet by mouth every 6 (six) hours as needed for moderate pain. 10/12/15   Hope Orlene Och, NP  HYDROmorphone (DILAUDID) 4 MG tablet Take 1 tablet (4 mg total) by mouth every 6 (six) hours as needed for severe pain. 09/06/15   Vanetta Mulders, MD  naproxen (NAPROSYN) 500 MG tablet Take 1 tablet (500 mg total) by mouth 2  (two) times daily. 10/12/15   Hope Orlene Och, NP  ondansetron (ZOFRAN) 4 MG tablet Take 1 tablet (4 mg total) by mouth every 6 (six) hours. 06/30/14   Raeford Razor, MD  pantoprazole (PROTONIX) 20 MG tablet Take 1 tablet (20 mg total) by mouth 2 (two) times daily. 09/12/15   Marily Memos, MD  promethazine (PHENERGAN) 25 MG tablet Take 1 tablet (25 mg total) by mouth every 6 (six) hours as needed for nausea or vomiting. 09/04/14   Blake Divine, MD  promethazine (PHENERGAN) 25 MG tablet Take 1 tablet (25 mg total) by mouth every 6 (six) hours as needed for nausea or vomiting. 09/06/15   Vanetta Mulders, MD  ranitidine (ZANTAC) 150 MG tablet Take 1 tablet (150 mg total) by mouth 2 (two) times daily. 09/04/14   Blake Divine, MD  sucralfate (CARAFATE) 1 GM/10ML suspension Take 10 mLs (1 g total) by mouth 4 (four) times daily -  with meals and at bedtime. 09/12/15   Marily Memos, MD    Family History History reviewed. No pertinent family history.  Social History Social History  Substance Use Topics  . Smoking status: Former Smoker    Quit date: 08/05/2014  . Smokeless tobacco: Never Used  . Alcohol use No     Allergies   Ultram [tramadol]  Review of Systems Review of Systems  Constitutional: Negative for chills and fever.  HENT: Negative for congestion and facial swelling.   Eyes: Negative for discharge and visual disturbance.  Respiratory: Negative for shortness of breath.   Cardiovascular: Negative for chest pain and palpitations.  Gastrointestinal: Negative for abdominal pain, diarrhea and vomiting.  Musculoskeletal: Positive for arthralgias, gait problem and myalgias.  Skin: Negative for color change and rash.  Neurological: Negative for tremors, syncope, numbness and headaches.  Psychiatric/Behavioral: Negative for confusion and dysphoric mood.     Physical Exam Updated Vital Signs BP 116/75 (BP Location: Right Arm)   Pulse 105   Temp 98.3 F (36.8 C) (Oral)   Resp 16   Ht 5\' 9"   (1.753 m)   Wt 250 lb (113.4 kg)   SpO2 96%   BMI 36.92 kg/m   Physical Exam  Constitutional: He is oriented to person, place, and time. He appears well-developed and well-nourished.  HENT:  Head: Normocephalic and atraumatic.  Eyes: Conjunctivae and EOM are normal. Pupils are equal, round, and reactive to light.  Neck: Normal range of motion. No JVD present.  Cardiovascular: Normal rate and regular rhythm.   Pulmonary/Chest: Effort normal. No stridor. No respiratory distress.  Abdominal: He exhibits no distension. There is no tenderness. There is no guarding.  Musculoskeletal: Normal range of motion. He exhibits tenderness. He exhibits no edema.  Mild R medial knee pain.  Mild pain with palpation into the medial joint space.  Full ROM, no noted pain with mcmurrays test.  No ligamentous laxity.   Neurological: He is alert and oriented to person, place, and time.  Skin: Skin is warm and dry.  Psychiatric: He has a normal mood and affect. His behavior is normal.     ED Treatments / Results  Labs (all labs ordered are listed, but only abnormal results are displayed) Labs Reviewed - No data to display  EKG  EKG Interpretation None       Radiology No results found.  Procedures Procedures (including critical care time)  Medications Ordered in ED Medications  acetaminophen (TYLENOL) tablet 1,000 mg (not administered)  ketorolac (TORADOL) injection 60 mg (not administered)     Initial Impression / Assessment and Plan / ED Course  I have reviewed the triage vital signs and the nursing notes.  Pertinent labs & imaging results that were available during my care of the patient were reviewed by me and considered in my medical decision making (see chart for details).  Clinical Course    25 yo M With a chief complaint of left knee pain. This pain is not reproducible on exam. He has no effusion. I was in a knee immobilizer given crutches and have him follow-up with sports  medicine.  5:43 PM:  I have discussed the diagnosis/risks/treatment options with the patient and family and believe the pt to be eligible for discharge home to follow-up with Sports med. We also discussed returning to the ED immediately if new or worsening sx occur. We discussed the sx which are most concerning (e.g., redness, fever, swelling) that necessitate immediate return. Medications administered to the patient during their visit and any new prescriptions provided to the patient are listed below.  Medications given during this visit Medications  acetaminophen (TYLENOL) tablet 1,000 mg (not administered)  ketorolac (TORADOL) injection 60 mg (not administered)     The patient appears reasonably screen and/or stabilized for discharge and I doubt any other medical condition or other [facility] requiring further screening,  evaluation, or treatment in the ED at this time prior to discharge.    Final Clinical Impressions(s) / ED Diagnoses   Final diagnoses:  Left knee pain    New Prescriptions New Prescriptions   No medications on file     Melene Plan, DO 11/08/15 1743

## 2015-11-08 NOTE — Discharge Instructions (Signed)
Take 4 over the counter ibuprofen tablets 3 times a day or 2 over-the-counter naproxen tablets twice a day for pain. Also take tylenol 1000mg(2 extra strength) four times a day.    

## 2015-11-08 NOTE — ED Triage Notes (Signed)
Pt c/o left knee pain x 1 day

## 2015-11-24 ENCOUNTER — Encounter (HOSPITAL_BASED_OUTPATIENT_CLINIC_OR_DEPARTMENT_OTHER): Payer: Self-pay | Admitting: Emergency Medicine

## 2015-11-24 ENCOUNTER — Emergency Department (HOSPITAL_BASED_OUTPATIENT_CLINIC_OR_DEPARTMENT_OTHER)
Admission: EM | Admit: 2015-11-24 | Discharge: 2015-11-24 | Disposition: A | Discharge status: Home / Self Care | Payer: Managed Care, Other (non HMO) | Attending: Emergency Medicine | Admitting: Emergency Medicine

## 2015-11-24 DIAGNOSIS — L03032 Cellulitis of left toe: Secondary | ICD-10-CM

## 2015-11-24 DIAGNOSIS — Z87891 Personal history of nicotine dependence: Secondary | ICD-10-CM | POA: Insufficient documentation

## 2015-11-24 DIAGNOSIS — L03031 Cellulitis of right toe: Secondary | ICD-10-CM | POA: Insufficient documentation

## 2015-11-24 DIAGNOSIS — Z79899 Other long term (current) drug therapy: Secondary | ICD-10-CM | POA: Insufficient documentation

## 2015-11-24 MED ORDER — CEPHALEXIN 500 MG PO CAPS: 500.0000 mg | ORAL_CAPSULE | Freq: Four times a day (QID) | ORAL | 0 refills | Status: DC

## 2015-11-24 NOTE — ED Provider Notes (Signed)
[facility]   CSN: [id number] Arrival date & time: 11/24/15  1152     History   Chief Complaint Chief Complaint  Patient presents with  . Toe Pain    HPI Ricardo Sweeney is a 25 y.o. male.  HPI   25 year old male presents today with left great toe pain. Patient reports yesterday he noticed redness and tenderness to palpation of the medial nail edge. He reports this morning he was able to push out a small amount of purulent discharge. Patient notes ingrown toenail the right previously that had to be removed. He denies any other signs of infectious etiology. Patient reports that he ripped the distal aspect of his toenail off several days ago which likely precipitated the ingrown toenail.  Past Medical History:  Diagnosis Date  . Clostridium difficile infection   . Kidney stone   . Kidney stone   . Multiple gastric ulcers     There are no active problems to display for this patient.   Past Surgical History:  Procedure Laterality Date  . COLONOSCOPY    . kidney stone stent    . LITHOTRIPSY         Home Medications    Prior to Admission medications   Medication Sig Start Date End Date Taking? Authorizing Provider  cephALEXin (KEFLEX) 500 MG capsule Take 1 capsule (500 mg total) by mouth 4 (four) times daily. 11/24/15   Tykira Wachs, PA-C  HYDROcodone-ibuprofen (VICOPROFEN) 7.5-200 MG tablet Take 1 tablet by mouth every 6 (six) hours as needed for moderate pain. 10/12/15   Hope Orlene OchM Neese, NP  HYDROmorphone (DILAUDID) 4 MG tablet Take 1 tablet (4 mg total) by mouth every 6 (six) hours as needed for severe pain. 09/06/15   Vanetta MuldersScott Zackowski, MD  naproxen (NAPROSYN) 500 MG tablet Take 1 tablet (500 mg total) by mouth 2 (two) times daily. 10/12/15   Hope Orlene OchM Neese, NP  ondansetron (ZOFRAN) 4 MG tablet Take 1 tablet (4 mg total) by mouth every 6 (six) hours. 06/30/14   Raeford RazorStephen Kohut, MD  pantoprazole (PROTONIX) 20 MG tablet Take 1 tablet (20 mg total) by mouth  2 (two) times daily. 09/12/15   Marily MemosJason Mesner, MD  promethazine (PHENERGAN) 25 MG tablet Take 1 tablet (25 mg total) by mouth every 6 (six) hours as needed for nausea or vomiting. 09/04/14   Blake DivineJohn Wofford, MD  promethazine (PHENERGAN) 25 MG tablet Take 1 tablet (25 mg total) by mouth every 6 (six) hours as needed for nausea or vomiting. 09/06/15   Vanetta MuldersScott Zackowski, MD  ranitidine (ZANTAC) 150 MG tablet Take 1 tablet (150 mg total) by mouth 2 (two) times daily. 09/04/14   Blake DivineJohn Wofford, MD  sucralfate (CARAFATE) 1 GM/10ML suspension Take 10 mLs (1 g total) by mouth 4 (four) times daily -  with meals and at bedtime. 09/12/15   Marily MemosJason Mesner, MD    Family History No family history on file.  Social History Social History  Substance Use Topics  . Smoking status: Former Smoker    Quit date: 08/05/2014  . Smokeless tobacco: Never Used  . Alcohol use No     Allergies   Tape and Ultram [tramadol]   Review of Systems Review of Systems  All other systems reviewed and are negative.    Physical Exam Updated Vital Signs BP 131/79 (BP Location: Right Arm)   Pulse 86   Temp 98 F (36.7 C) (Oral)   Ht 5\' 9"  (1.753 m)   Wt 114.8  kg   SpO2 97%   BMI 37.36 kg/m   Physical Exam  Constitutional: He is oriented to person, place, and time. He appears well-developed and well-nourished.  HENT:  Head: Normocephalic and atraumatic.  Eyes: Conjunctivae are normal. Pupils are equal, round, and reactive to light. Right eye exhibits no discharge. Left eye exhibits no discharge. No scleral icterus.  Neck: Normal range of motion. No JVD present. No tracheal deviation present.  Pulmonary/Chest: Effort normal. No stridor.  Musculoskeletal:  Minor amount of erythema along the lateral nail fold of the left great toe, no fluctuance, no purulence  Neurological: He is alert and oriented to person, place, and time. Coordination normal.  Psychiatric: He has a normal mood and affect. His behavior is normal. Judgment  and thought content normal.  Nursing note and vitals reviewed.    ED Treatments / Results  Labs (all labs ordered are listed, but only abnormal results are displayed) Labs Reviewed - No data to display  EKG  EKG Interpretation None       Radiology No results found.  Procedures Procedures (including critical care time)  Medications Ordered in ED Medications - No data to display   Initial Impression / Assessment and Plan / ED Course  I have reviewed the triage vital signs and the nursing notes.  Pertinent labs & imaging results that were available during my care of the patient were reviewed by me and considered in my medical decision making (see chart for details).  Clinical Course     Final Clinical Impressions(s) / ED Diagnoses   Final diagnoses:  Paronychia of great toe, left    Labs:  Imaging:  Consults:  Therapeutics:  Discharge Meds:   Assessment/Plan:  25 year old male presents today with likely paronychia. Patient has no fluctuance, he has been able to produce purulent drainage from this. He'll be placed on antibiotics, instructed to use warm soaks, follow-up with primary care for reevaluation or return to emergency room if any new or worsening signs or symptoms present. Patient verbalizes understanding and agreement to today's plan had no further questions or concerns at time of discharge.    New Prescriptions New Prescriptions   CEPHALEXIN (KEFLEX) 500 MG CAPSULE    Take 1 capsule (500 mg total) by mouth 4 (four) times daily.     Eyvonne Mechanic, PA-C 11/24/15 1455    Jerelyn Scott, MD 11/24/15 [phone]

## 2015-11-24 NOTE — Discharge Instructions (Signed)
Please read attached information. If you experience any new or worsening signs or symptoms please return to the emergency room for evaluation. Please follow-up with your primary care provider or specialist as discussed. Please use medication prescribed only as directed and discontinue taking if you have any concerning signs or symptoms.   °

## 2015-11-24 NOTE — ED Notes (Signed)
PA at bedside.

## 2015-11-24 NOTE — ED Triage Notes (Signed)
Pt c/o redness and tenderness to left big toe with pus draining.

## 2015-11-24 NOTE — ED Notes (Signed)
Pt given d/c instructions as per chart. Verbalizes understanding. No questions. Rx x 1 

## 2015-11-28 ENCOUNTER — Emergency Department (HOSPITAL_BASED_OUTPATIENT_CLINIC_OR_DEPARTMENT_OTHER): Payer: Medicaid - Out of State

## 2015-11-28 ENCOUNTER — Emergency Department (HOSPITAL_BASED_OUTPATIENT_CLINIC_OR_DEPARTMENT_OTHER)
Admission: EM | Admit: 2015-11-28 | Discharge: 2015-11-28 | Disposition: A | Discharge status: Home / Self Care | Payer: Medicaid - Out of State | Attending: Emergency Medicine | Admitting: Emergency Medicine

## 2015-11-28 ENCOUNTER — Encounter (HOSPITAL_BASED_OUTPATIENT_CLINIC_OR_DEPARTMENT_OTHER): Payer: Self-pay | Admitting: Emergency Medicine

## 2015-11-28 DIAGNOSIS — R109 Unspecified abdominal pain: Secondary | ICD-10-CM | POA: Insufficient documentation

## 2015-11-28 DIAGNOSIS — R112 Nausea with vomiting, unspecified: Secondary | ICD-10-CM | POA: Diagnosis not present

## 2015-11-28 DIAGNOSIS — N2889 Other specified disorders of kidney and ureter: Secondary | ICD-10-CM | POA: Insufficient documentation

## 2015-11-28 DIAGNOSIS — Z765 Malingerer [conscious simulation]: Secondary | ICD-10-CM

## 2015-11-28 DIAGNOSIS — Z87891 Personal history of nicotine dependence: Secondary | ICD-10-CM | POA: Diagnosis not present

## 2015-11-28 DIAGNOSIS — Z7289 Other problems related to lifestyle: Secondary | ICD-10-CM | POA: Insufficient documentation

## 2015-11-28 HISTORY — DX: Opioid abuse, uncomplicated: F11.10

## 2015-11-28 HISTORY — DX: Malingerer (conscious simulation): Z76.5

## 2015-11-28 LAB — URINALYSIS, ROUTINE W REFLEX MICROSCOPIC
Bilirubin Urine: NEGATIVE
GLUCOSE, UA: NEGATIVE mg/dL
Ketones, ur: NEGATIVE mg/dL
LEUKOCYTES UA: NEGATIVE
Nitrite: NEGATIVE
PH: 5.5 (ref 5.0–8.0)
Protein, ur: NEGATIVE mg/dL
Specific Gravity, Urine: 1.027 (ref 1.005–1.030)

## 2015-11-28 LAB — URINE MICROSCOPIC-ADD ON

## 2015-11-28 MED ORDER — PROCHLORPERAZINE EDISYLATE 5 MG/ML IJ SOLN
10.0000 mg | Freq: Once | INTRAMUSCULAR | Status: AC
  Administered 2015-11-28: 10 mg via INTRAMUSCULAR
  Filled 2015-11-28: qty 2

## 2015-11-28 MED ORDER — ACETAMINOPHEN 500 MG PO TABS
1000.0000 mg | ORAL_TABLET | Freq: Once | ORAL | Status: AC
  Administered 2015-11-28: 1000 mg via ORAL
  Filled 2015-11-28: qty 2

## 2015-11-28 MED ORDER — KETOROLAC TROMETHAMINE 60 MG/2ML IM SOLN
30.0000 mg | Freq: Once | INTRAMUSCULAR | Status: AC
  Administered 2015-11-28: 30 mg via INTRAMUSCULAR
  Filled 2015-11-28: qty 2

## 2015-11-28 MED ORDER — OXYCODONE HCL 5 MG PO TABS
5.0000 mg | ORAL_TABLET | Freq: Once | ORAL | Status: AC
  Administered 2015-11-28: 5 mg via ORAL
  Filled 2015-11-28: qty 1

## 2015-11-28 NOTE — ED Provider Notes (Signed)
[facility]   CSN: [id number] Arrival date & time: 11/28/15  1551     History   Chief Complaint Chief Complaint  Patient presents with  . Flank Pain    HPI Ricardo Sweeney is a 25 y.o. male.  26 yo M with a chief complaint of right flank pain. Patient has been having this issue off and on for the past few days. Was seen at an outside emergency departments and had a CT scan that was negative for nephrolithiasis. Patient continuing to have symptoms worsening today associated with some vomiting. Denies fevers or chills. Feels just like his prior kidney stones. Has pain that radiates down into his testicle. Denies diarrhea.   The history is provided by the patient and a relative.  Flank Pain  This is a recurrent problem. The current episode started 1 to 2 hours ago. The problem occurs constantly. The problem has not changed since onset.Associated symptoms include abdominal pain. Pertinent negatives include no chest pain, no headaches and no shortness of breath. Nothing aggravates the symptoms. Nothing relieves the symptoms. He has tried nothing for the symptoms. The treatment provided no relief.    Past Medical History:  Diagnosis Date  . Clostridium difficile infection   . Drug-seeking behavior   . Kidney stone   . Kidney stone   . Multiple gastric ulcers   . Narcotic abuse     There are no active problems to display for this patient.   Past Surgical History:  Procedure Laterality Date  . COLONOSCOPY    . kidney stone stent    . LITHOTRIPSY         Home Medications    Prior to Admission medications   Medication Sig Start Date End Date Taking? Authorizing Provider  cephALEXin (KEFLEX) 500 MG capsule Take 1 capsule (500 mg total) by mouth 4 (four) times daily. 11/24/15   Jeffrey Hedges, PA-C  HYDROcodone-ibuprofen (VICOPROFEN) 7.5-200 MG tablet Take 1 tablet by mouth every 6 (six) hours as needed for moderate pain. 10/12/15   Hope Orlene Och, NP    HYDROmorphone (DILAUDID) 4 MG tablet Take 1 tablet (4 mg total) by mouth every 6 (six) hours as needed for severe pain. 09/06/15   Vanetta Mulders, MD  naproxen (NAPROSYN) 500 MG tablet Take 1 tablet (500 mg total) by mouth 2 (two) times daily. 10/12/15   Hope Orlene Och, NP  ondansetron (ZOFRAN) 4 MG tablet Take 1 tablet (4 mg total) by mouth every 6 (six) hours. 06/30/14   Raeford Razor, MD  pantoprazole (PROTONIX) 20 MG tablet Take 1 tablet (20 mg total) by mouth 2 (two) times daily. 09/12/15   Marily Memos, MD  promethazine (PHENERGAN) 25 MG tablet Take 1 tablet (25 mg total) by mouth every 6 (six) hours as needed for nausea or vomiting. 09/04/14   Blake Divine, MD  promethazine (PHENERGAN) 25 MG tablet Take 1 tablet (25 mg total) by mouth every 6 (six) hours as needed for nausea or vomiting. 09/06/15   Vanetta Mulders, MD  ranitidine (ZANTAC) 150 MG tablet Take 1 tablet (150 mg total) by mouth 2 (two) times daily. 09/04/14   Blake Divine, MD  sucralfate (CARAFATE) 1 GM/10ML suspension Take 10 mLs (1 g total) by mouth 4 (four) times daily -  with meals and at bedtime. 09/12/15   Marily Memos, MD    Family History No family history on file.  Social History Social History  Substance Use Topics  . Smoking status: Former Smoker  Quit date: 08/05/2014  . Smokeless tobacco: Never Used  . Alcohol use No     Allergies   Tape and Ultram [tramadol]   Review of Systems Review of Systems  Constitutional: Negative for chills and fever.  HENT: Negative for congestion and facial swelling.   Eyes: Negative for discharge and visual disturbance.  Respiratory: Negative for shortness of breath.   Cardiovascular: Negative for chest pain and palpitations.  Gastrointestinal: Positive for abdominal pain, nausea and vomiting. Negative for diarrhea.  Genitourinary: Positive for flank pain.  Musculoskeletal: Negative for arthralgias and myalgias.  Skin: Negative for color change and rash.  Neurological:  Negative for tremors, syncope and headaches.  Psychiatric/Behavioral: Negative for confusion and dysphoric mood.     Physical Exam Updated Vital Signs BP 105/74 (BP Location: Right Arm)   Pulse 72   Temp 98.2 F (36.8 C) (Oral)   Resp 18   SpO2 100%   Physical Exam  Constitutional: He is oriented to person, place, and time. He appears well-developed and well-nourished.  HENT:  Head: Normocephalic and atraumatic.  Eyes: EOM are normal. Pupils are equal, round, and reactive to light.  Neck: Normal range of motion. Neck supple. No JVD present.  Cardiovascular: Normal rate and regular rhythm.  Exam reveals no gallop and no friction rub.   No murmur heard. Pulmonary/Chest: No respiratory distress. He has no wheezes.  Abdominal: He exhibits no distension and no mass. There is no tenderness. There is no rebound and no guarding. Hernia confirmed negative in the right inguinal area and confirmed negative in the left inguinal area.  No CVA tenderness  Genitourinary: Testes normal. Cremasteric reflex is present. Right testis shows no mass, no swelling and no tenderness. Cremasteric reflex is not absent on the right side. Left testis shows no mass, no swelling and no tenderness. Cremasteric reflex is not absent on the left side.  Musculoskeletal: Normal range of motion.  Lymphadenopathy: No inguinal adenopathy noted on the right or left side.  Neurological: He is alert and oriented to person, place, and time.  Skin: No rash noted. No pallor.  Psychiatric: He has a normal mood and affect. His behavior is normal.  Nursing note and vitals reviewed.    ED Treatments / Results  Labs (all labs ordered are listed, but only abnormal results are displayed) Labs Reviewed  URINALYSIS, ROUTINE W REFLEX MICROSCOPIC ([facility]) - Abnormal; Notable for the following:       Result Value   APPearance CLOUDY (*)    Hgb urine dipstick LARGE (*)    All other components within normal limits  URINE  MICROSCOPIC-ADD ON - Abnormal; Notable for the following:    Squamous Epithelial / LPF 0-5 (*)    Bacteria, UA MANY (*)    All other components within normal limits    EKG  EKG Interpretation None       Radiology [place] Renal  Result Date: 11/28/2015 CLINICAL DATA:  Right-sided abdominal pain for 3 hours. EXAM: RENAL / URINARY TRACT ULTRASOUND COMPLETE COMPARISON:  CT scan 09/06/2015, ultrasound abdomen 08/28/2015 FINDINGS: Right Kidney: Length: 10.5 cm. Echogenicity within normal limits. No mass or hydronephrosis visualized. Punctate nonspecific echo dense foci measuring up to 4 mm within the midpole. Left Kidney: Length: 10.4 cm. Echogenicity within normal limits. No mass or hydronephrosis visualized. Punctate nonspecific echodense foci within the midpole measuring up to 4 mm. Bladder: Appears normal for degree of bladder distention. Liver appears echogenic. IMPRESSION: 1. Punctate echo dense foci within the  bilateral kidneys, on the right, 1 of these may correspond to punctate calculus imaged on recent CT of the abdomen pelvis. There is no hydronephrosis identified. 2. Liver appears slightly echogenic, which can be seen with fatty infiltration. Clinical correlation is recommended. Electronically Signed   By: Jasmine Pang M.D.   On: 11/28/2015 18:36    Procedures Procedures (including critical care time)  Medications Ordered in ED Medications  ketorolac (TORADOL) injection 30 mg (30 mg Intramuscular Given 11/28/15 1632)  prochlorperazine (COMPAZINE) injection 10 mg (10 mg Intramuscular Given 11/28/15 1632)  acetaminophen (TYLENOL) tablet 1,000 mg (1,000 mg Oral Given 11/28/15 1632)  oxyCODONE (Oxy IR/ROXICODONE) immediate release tablet 5 mg (5 mg Oral Given 11/28/15 1632)     Initial Impression / Assessment and Plan / ED Course  I have reviewed the triage vital signs and the nursing notes.  Pertinent labs & imaging results that were available during my care of the patient were reviewed  by me and considered in my medical decision making (see chart for details).  Clinical Course    25 yo M With a chief complaint of right flank pain. Patient had a CT scan just 2 days ago that was negative for a kidney stone in the ureter. Will obtain a ultrasound to limit radiation. Discussed with patient that he would likely not warrant outpatient narcotics based on his symptoms.  UA with blood, no nitrite or LE, with TNTC bacteria.  With recent visit at other hospital and 7 ed visits in the last 6 months I am concerned that he may be narcotic seeking. [place] without hydro, d/c home.   7:02 PM:  I have discussed the diagnosis/risks/treatment options with the patient and family and believe the pt to be eligible for discharge home to follow-up with Urology. We also discussed returning to the ED immediately if new or worsening sx occur. We discussed the sx which are most concerning (e.g., sudden worsening pain, fever, inability to tolerate by mouth) that necessitate immediate return. Medications administered to the patient during their visit and any new prescriptions provided to the patient are listed below.  Medications given during this visit Medications  ketorolac (TORADOL) injection 30 mg (30 mg Intramuscular Given 11/28/15 1632)  prochlorperazine (COMPAZINE) injection 10 mg (10 mg Intramuscular Given 11/28/15 1632)  acetaminophen (TYLENOL) tablet 1,000 mg (1,000 mg Oral Given 11/28/15 1632)  oxyCODONE (Oxy IR/ROXICODONE) immediate release tablet 5 mg (5 mg Oral Given 11/28/15 1632)     The patient appears reasonably screen and/or stabilized for discharge and I doubt any other medical condition or other [facility] requiring further screening, evaluation, or treatment in the ED at this time prior to discharge.    Final Clinical Impressions(s) / ED Diagnoses   Final diagnoses:  Right flank pain  Drug-seeking behavior    New Prescriptions Discharge Medication List as of 11/28/2015  6:40 PM       Melene Plan, DO 11/28/15 1902

## 2015-11-28 NOTE — Discharge Instructions (Signed)
Take 4 over the counter ibuprofen tablets 3 times a day or 2 over-the-counter naproxen tablets twice a day for pain. Also take tylenol 1000mg(2 extra strength) four times a day.    

## 2015-11-28 NOTE — ED Notes (Signed)
MD at bedside. 

## 2015-11-28 NOTE — ED Triage Notes (Signed)
R flank pain, nausea and hematuria onset 3 hours ago. Hx of kidney stones.

## 2015-11-28 NOTE — ED Notes (Signed)
Pt encouraged to give urine sample

## 2015-11-28 NOTE — ED Notes (Signed)
Pt was seen [facility] on 11/26/15 and had CT and blood work done at that time.

## 2015-11-28 NOTE — ED Notes (Signed)
Pt requesting more pain medication and rx for pain medication. Dr. Adela LankFloyd made aware and instructed pt use OTC tylenol and ibuprofen for pain.

## 2015-12-24 ENCOUNTER — Ambulatory Visit (INDEPENDENT_AMBULATORY_CARE_PROVIDER_SITE_OTHER): Payer: Medicaid - Out of State | Admitting: Family Medicine

## 2015-12-24 ENCOUNTER — Encounter: Payer: Self-pay | Admitting: Family Medicine

## 2015-12-24 ENCOUNTER — Emergency Department (HOSPITAL_BASED_OUTPATIENT_CLINIC_OR_DEPARTMENT_OTHER)
Admission: EM | Admit: 2015-12-24 | Discharge: 2015-12-24 | Disposition: A | Discharge status: Home / Self Care | Payer: Medicaid - Out of State | Attending: Emergency Medicine | Admitting: Emergency Medicine

## 2015-12-24 ENCOUNTER — Encounter (HOSPITAL_BASED_OUTPATIENT_CLINIC_OR_DEPARTMENT_OTHER): Payer: Self-pay

## 2015-12-24 DIAGNOSIS — F17210 Nicotine dependence, cigarettes, uncomplicated: Secondary | ICD-10-CM | POA: Insufficient documentation

## 2015-12-24 DIAGNOSIS — M25362 Other instability, left knee: Secondary | ICD-10-CM | POA: Diagnosis not present

## 2015-12-24 DIAGNOSIS — M25562 Pain in left knee: Secondary | ICD-10-CM | POA: Diagnosis present

## 2015-12-24 DIAGNOSIS — S8992XA Unspecified injury of left lower leg, initial encounter: Secondary | ICD-10-CM | POA: Diagnosis present

## 2015-12-24 MED ORDER — DICLOFENAC SODIUM 75 MG PO TBEC: 75.0000 mg | DELAYED_RELEASE_TABLET | Freq: Two times a day (BID) | ORAL | 1 refills | Status: DC

## 2015-12-24 NOTE — ED Triage Notes (Signed)
C/o cont'd left leg pain-states "leg gave out on me" today-presents to triage in w/c-NAD

## 2015-12-24 NOTE — ED Provider Notes (Signed)
[facility]   CSN: [id number] Arrival date & time: 12/24/15  1205     History   Chief Complaint Chief Complaint  Patient presents with  . Leg Pain    HPI Ricardo Sweeney is a 25 y.o. male.  HPI Patient states that he injured his knee over a month ago coming off a ladder. He reports that he twisted it or misstepped. He has been wearing a knee immobilizer and using crutches. He reports that it has never really recovered and he has problems with the knee just giving out on him suddenly. He reports he continues to have pain and he indicates the medial joint line. Past Medical History:  Diagnosis Date  . Clostridium difficile infection   . Drug-seeking behavior   . Kidney stone   . Kidney stone   . Multiple gastric ulcers   . Narcotic abuse     There are no active problems to display for this patient.   Past Surgical History:  Procedure Laterality Date  . COLONOSCOPY    . kidney stone stent    . LITHOTRIPSY         Home Medications    Prior to Admission medications   Medication Sig Start Date End Date Taking? Authorizing Provider  ondansetron (ZOFRAN) 4 MG tablet Take 1 tablet (4 mg total) by mouth every 6 (six) hours. 06/30/14   Raeford RazorStephen Kohut, MD    Family History No family history on file.  Social History Social History  Substance Use Topics  . Smoking status: Current Every Day Smoker    Types: E-cigarettes  . Smokeless tobacco: Never Used  . Alcohol use No     Allergies   Tape and Ultram [tramadol]   Review of Systems Review of Systems Respiratory: No shortness of breath no chest pain Constitutional: No fever no chills or general malaise  Physical Exam Updated Vital Signs BP 122/87   Pulse 95   Temp 98.1 F (36.7 C) (Oral)   Resp 18   Ht 5\' 9"  (1.753 m)   Wt 248 lb (112.5 kg)   SpO2 98%   BMI 36.62 kg/m   Physical Exam  Constitutional:  Patient is alert and nontoxic. Moderately obese. No respiratory  distress.  HENT:  Head: Normocephalic and atraumatic.  Eyes: EOM are normal.  Cardiovascular: Normal rate, regular rhythm, normal heart sounds and intact distal pulses.   Pulmonary/Chest: Effort normal and breath sounds normal.  Musculoskeletal:  Both knees are symmetric in appearance. No visible effusion or erythema or deformity of the left knee. Patient endorses tenderness along the medial joint line. The patient is able to flex and extend at the joint however reports pain with flexion. I cannot appreciate any laxity or drawer motion anterior posteriorly. Calf is soft and nontender. Distal pulses 2+ and strong. Foot Warm and dry without edema.  Neurological: He is alert. He exhibits normal muscle tone. Coordination normal.  Skin: Skin is warm and dry.  Psychiatric: He has a normal mood and affect.     ED Treatments / Results  Labs (all labs ordered are listed, but only abnormal results are displayed) Labs Reviewed - No data to display  EKG  EKG Interpretation None       Radiology No results found.  Procedures Procedures (including critical care time)  Medications Ordered in ED Medications - No data to display   Initial Impression / Assessment and Plan / ED Course  I have reviewed the triage vital signs and  the nursing notes.  Pertinent labs & imaging results that were available during my care of the patient were reviewed by me and considered in my medical decision making (see chart for details).  Clinical Course    Consultation: Patient's case was reviewed with Dr. Pearletha ForgeHudnall. He will be able a see the patient in his office today subsequent to his emergency department visit for further evaluation.  Final Clinical Impressions(s) / ED Diagnoses   Final diagnoses:  Knee instability, left  Patient presents with old knee and injury. He reports continued instability and buckling of the knee. No effusion is present and no other evidence of acute soft tissue injury or  infection. Patient will follow-up with Dr. Pearletha ForgeHudnall after his emergency department evaluation for further evaluation of post traumatic joint instability.  New Prescriptions New Prescriptions   No medications on file     Arby BarretteMarcy Viana Sleep, MD 12/24/15 1356

## 2015-12-24 NOTE — Patient Instructions (Signed)
Call us when your medicaid goes through - bring the card in and we will go ahead with an MRI of your knee. I'm concerned you have a medial meniscus tear. You may have sprained your MCL also but this has healed. Icing 15 minutes at a time 3-4 times a day. Knee brace for support when up and walking around. Use crutches if needed. Straight leg raises, knee extensions 3 sets of 10 once a day. Add ankle weight if these are too easy. Tylenol 500mg  1-2 tabs three times a day Diclofenac 75mg  twice a day with food for pain and inflammation. Capsaicin may help with pain topically - up to 4 times a day. Tramadol is the strongest we prescribe but you are allergic to this medicine for pain.

## 2015-12-25 DIAGNOSIS — S8992XA Unspecified injury of left lower leg, initial encounter: Secondary | ICD-10-CM | POA: Insufficient documentation

## 2015-12-25 NOTE — Progress Notes (Signed)
PCP: No PCP Per Patient  Subjective:   HPI: Patient is a 25 y.o. male here for left knee injury.  Patient reports on 9/16 he was about 4-5 feet up on a ladder when he fell off backwards, landed on foot and back. Thinks he twisted his knee. Had been wearing immobilizer or a sleeve. Using crutches. + swelling initially. Also had bruising. Pain is medial, 8/10 and sharp. No prior injuries. No skin changes, numbness.  Past Medical History:  Diagnosis Date  . Clostridium difficile infection   . Drug-seeking behavior   . Kidney stone   . Kidney stone   . Multiple gastric ulcers   . Narcotic abuse     No current outpatient prescriptions on file prior to visit.   No current facility-administered medications on file prior to visit.     Past Surgical History:  Procedure Laterality Date  . COLONOSCOPY    . kidney stone stent    . LITHOTRIPSY      Allergies  Allergen Reactions  . Tape Hives  . Ultram [Tramadol]     Social History   Social History  . Marital status: Married    Spouse name: N/A  . Number of children: N/A  . Years of education: N/A   Occupational History  . Not on file.   Social History Main Topics  . Smoking status: Current Every Day Smoker    Types: E-cigarettes  . Smokeless tobacco: Never Used  . Alcohol use No  . Drug use: No  . Sexual activity: Not on file   Other Topics Concern  . Not on file   Social History Narrative  . No narrative on file    No family history on file.  BP 137/87   Pulse 98   Ht 5\' 9"  (1.753 m)   Wt 248 lb (112.5 kg)   BMI 36.62 kg/m   Review of Systems: See HPI above.    Objective:  Physical Exam:  Gen: NAD, comfortable in exam room  Left knee: No gross deformity, ecchymoses, effusion. TTP medial joint line only.  No other tenderness. FROM. Negative ant/post drawers. Negative valgus/varus testing. Negative lachmanns. Positive mcmurrays, apleys.  Negative patellar apprehension. NV intact  distally.  Right knee: FROM without pain.    Assessment & Plan:  1. Left knee injury - Independently reviewed radiographs from 9/17 and no evidence fracture.  Notes then indicate MCL sprain - this has clinically healed at this point.  Other ligaments intact.  Meniscus testing is positive though - suspect medial meniscus tear.  Will call us when medicaid goes through to go ahead with MRI.  Icing, knee brace for support.  Shown home exercises to do daily.  Tylenol, diclofenac, capsaicin.

## 2015-12-25 NOTE — Assessment & Plan Note (Signed)
Independently reviewed radiographs from 9/17 and no evidence fracture.  Notes then indicate MCL sprain - this has clinically healed at this point.  Other ligaments intact.  Meniscus testing is positive though - suspect medial meniscus tear.  Will call us when medicaid goes through to go ahead with MRI.  Icing, knee brace for support.  Shown home exercises to do daily.  Tylenol, diclofenac, capsaicin.

## 2016-01-20 ENCOUNTER — Encounter (HOSPITAL_BASED_OUTPATIENT_CLINIC_OR_DEPARTMENT_OTHER): Payer: Self-pay | Admitting: Emergency Medicine

## 2016-01-20 ENCOUNTER — Emergency Department (HOSPITAL_BASED_OUTPATIENT_CLINIC_OR_DEPARTMENT_OTHER): Payer: Medicaid - Out of State

## 2016-01-20 ENCOUNTER — Emergency Department (HOSPITAL_BASED_OUTPATIENT_CLINIC_OR_DEPARTMENT_OTHER)
Admission: EM | Admit: 2016-01-20 | Discharge: 2016-01-20 | Disposition: A | Discharge status: Home / Self Care | Payer: Medicaid - Out of State | Attending: Emergency Medicine | Admitting: Emergency Medicine

## 2016-01-20 DIAGNOSIS — Z79899 Other long term (current) drug therapy: Secondary | ICD-10-CM | POA: Insufficient documentation

## 2016-01-20 DIAGNOSIS — F17210 Nicotine dependence, cigarettes, uncomplicated: Secondary | ICD-10-CM | POA: Insufficient documentation

## 2016-01-20 DIAGNOSIS — R112 Nausea with vomiting, unspecified: Secondary | ICD-10-CM | POA: Diagnosis not present

## 2016-01-20 DIAGNOSIS — R109 Unspecified abdominal pain: Secondary | ICD-10-CM | POA: Diagnosis not present

## 2016-01-20 LAB — URINALYSIS, ROUTINE W REFLEX MICROSCOPIC
Glucose, UA: NEGATIVE mg/dL
Hgb urine dipstick: NEGATIVE
KETONES UR: NEGATIVE mg/dL
LEUKOCYTES UA: NEGATIVE
NITRITE: NEGATIVE
PH: 5.5 (ref 5.0–8.0)
Protein, ur: NEGATIVE mg/dL
SPECIFIC GRAVITY, URINE: 1.027 (ref 1.005–1.030)

## 2016-01-20 MED ORDER — KETOROLAC TROMETHAMINE 30 MG/ML IJ SOLN
30.0000 mg | Freq: Once | INTRAMUSCULAR | Status: AC
  Administered 2016-01-20: 30 mg via INTRAVENOUS
  Filled 2016-01-20: qty 1

## 2016-01-20 MED ORDER — ONDANSETRON HCL 4 MG/2ML IJ SOLN
4.0000 mg | Freq: Once | INTRAMUSCULAR | Status: AC
  Administered 2016-01-20: 4 mg via INTRAVENOUS
  Filled 2016-01-20: qty 2

## 2016-01-20 MED ORDER — ONDANSETRON 8 MG PO TBDP: 8.0000 mg | ORAL_TABLET | Freq: Three times a day (TID) | ORAL | 0 refills | Status: DC | PRN

## 2016-01-20 MED ORDER — IBUPROFEN 600 MG PO TABS: 600.0000 mg | ORAL_TABLET | Freq: Three times a day (TID) | ORAL | 0 refills | Status: DC | PRN

## 2016-01-20 MED ORDER — SODIUM CHLORIDE 0.9 % IV BOLUS (SEPSIS)
1000.0000 mL | Freq: Once | INTRAVENOUS | Status: AC
  Administered 2016-01-20: 1000 mL via INTRAVENOUS

## 2016-01-20 NOTE — ED Provider Notes (Signed)
[facility]   CSN: [id number] Arrival date & time: 01/20/16  0845     History   Chief Complaint Chief Complaint  Patient presents with  . Flank Pain    HPI Ricardo Sweeney is a 25 y.o. male.  HPI Patient reports nausea vomiting and increased right-sided flank pain this morning.  He states she's had 54 kidney stones before in the past.  He reports nausea now.  No active vomiting in the room.  He reports some increased frequency of urination in a dark urine which she is concerned could be blood.  He's tried anti-inflammatories without relief in his symptoms.  No other complaints this time.  No fevers or chills.  Denies dysuria.   Past Medical History:  Diagnosis Date  . Clostridium difficile infection   . Drug-seeking behavior   . Kidney stone   . Kidney stone   . Multiple gastric ulcers   . Narcotic abuse     Patient Active Problem List   Diagnosis Date Noted  . Left knee injury, initial encounter 12/25/2015  . Nephrolithiasis 12/26/2013    Past Surgical History:  Procedure Laterality Date  . COLONOSCOPY    . kidney stone stent    . LITHOTRIPSY         Home Medications    Prior to Admission medications   Medication Sig Start Date End Date Taking? Authorizing Provider  diclofenac (VOLTAREN) 75 MG EC tablet Take 1 tablet (75 mg total) by mouth 2 (two) times daily. 12/24/15   Lenda KelpShane R Hudnall, MD  ibuprofen (ADVIL,MOTRIN) 600 MG tablet Take 1 tablet (600 mg total) by mouth every 8 (eight) hours as needed. 01/20/16   Azalia BilisKevin Nathin Saran, MD  ondansetron (ZOFRAN ODT) 8 MG disintegrating tablet Take 1 tablet (8 mg total) by mouth every 8 (eight) hours as needed for nausea or vomiting. 01/20/16   Azalia BilisKevin Sherle Mello, MD    Family History History reviewed. No pertinent family history.  Social History Social History  Substance Use Topics  . Smoking status: Current Every Day Smoker    Types: E-cigarettes  . Smokeless tobacco: Never Used  . Alcohol  use No     Allergies   Tape and Ultram [tramadol]   Review of Systems Review of Systems  All other systems reviewed and are negative.    Physical Exam Updated Vital Signs BP 121/94 (BP Location: Left Arm)   Pulse (!) 122   Temp 98.9 F (37.2 C) (Oral)   Resp 18   Ht 5\' 9"  (1.753 m)   Wt 242 lb (109.8 kg)   SpO2 100%   BMI 35.74 kg/m   Physical Exam  Constitutional: He is oriented to person, place, and time. He appears well-developed and well-nourished.  HENT:  Head: Normocephalic and atraumatic.  Eyes: EOM are normal.  Neck: Normal range of motion.  Cardiovascular: Normal rate, regular rhythm, normal heart sounds and intact distal pulses.   Pulmonary/Chest: Effort normal and breath sounds normal. No respiratory distress.  Abdominal: Soft. He exhibits no distension. There is no tenderness.  Musculoskeletal: Normal range of motion.  Neurological: He is alert and oriented to person, place, and time.  Skin: Skin is warm and dry.  Psychiatric: He has a normal mood and affect. Judgment normal.  Nursing note and vitals reviewed.    ED Treatments / Results  Labs (all labs ordered are listed, but only abnormal results are displayed) Labs Reviewed  URINALYSIS, ROUTINE W REFLEX MICROSCOPIC ([facility]) - Abnormal;  Notable for the following:       Result Value   Bilirubin Urine SMALL (*)    All other components within normal limits    EKG  EKG Interpretation None       Radiology Ct Renal Stone Study  Result Date: 01/20/2016 CLINICAL DATA:  Right flank pain EXAM: CT ABDOMEN AND PELVIS WITHOUT CONTRAST TECHNIQUE: Multidetector CT imaging of the abdomen and pelvis was performed following the standard protocol without oral or intravenous contrast material administration. COMPARISON:  September 06, 2015 FINDINGS: Lower chest: There is slight atelectasis in right mid lung region. Visualized lungs otherwise appear clear. Hepatobiliary: No focal liver lesions are evident this  noncontrast enhanced study. Gallbladder wall is not appreciably thickened. There is no biliary duct dilatation. Pancreas: There is no pancreatic mass or inflammatory focus. Spleen: No splenic lesions are evident. Adrenals/Urinary Tract: Adrenals appear normal bilaterally. There is no renal mass or hydronephrosis on either side. There is a 2 mm calculus in the posterior right kidney lower pole region. There is a 1 mm calculus in the mid left kidney. There is no ureteral calculus on either side. The urinary bladder is midline with wall thickness within normal limits given the degree of bladder distention. Stomach/Bowel: There is no bowel wall or mesenteric thickening. There is no bowel obstruction. No free air or portal venous air. Colon is largely decompressed. Vascular/Lymphatic: There is no abdominal aortic aneurysm. No vascular lesion evident on this noncontrast enhanced study. There are subcentimeter lymph nodes in the right abdomen. By size criteria, there is no frank adenopathy in the abdomen or pelvis. Reproductive: Prostate and seminal vesicles appear normal in size contour. There is pelvic mass pelvic fluid collection. Other: Appendix is diminutive. No appendiceal inflammation. Several small periappendiceal nodes are noted. There is no abscess or ascites in the abdomen or pelvis. There is a minimal ventral hernia containing fat. Musculoskeletal: There are no blastic or lytic bone lesions. There is no intramuscular or abdominal wall lesion. IMPRESSION: Nonobstructing calculi in each kidney. No hydronephrosis or ureteral calculus on either side. There are subcentimeter lymph nodes in the right mid to lower abdomen. These lymph nodes in the appropriate clinical setting could be indicative of a degree of mesenteric adenitis. There is no adenopathy by size criteria in the abdomen or pelvis. There is no appendiceal inflammation. No bowel obstruction. No abscess. There is a minimal ventral hernia containing only  fat. Electronically Signed   By: Bretta BangWilliam  Woodruff III M.D.   On: 01/20/2016 09:42    Procedures Procedures (including critical care time)  Medications Ordered in ED Medications  ondansetron (ZOFRAN) injection 4 mg (4 mg Intravenous Given 01/20/16 0947)  ketorolac (TORADOL) 30 MG/ML injection 30 mg (30 mg Intravenous Given 01/20/16 0947)  sodium chloride 0.9 % bolus 1,000 mL (1,000 mLs Intravenous New Bag/Given 01/20/16 0948)     Initial Impression / Assessment and Plan / ED Course  I have reviewed the triage vital signs and the nursing notes.  Pertinent labs & imaging results that were available during my care of the patient were reviewed by me and considered in my medical decision making (see chart for details).  Clinical Course     Patient is overall comfortable appearing.  Imaging today demonstrates no evidence of ureteral stone.  Urine is without signs of infection.  Feeling better this time.  Discharge home in good condition.  Primary care and urology follow-up.  Final Clinical Impressions(s) / ED Diagnoses   Final diagnoses:  Flank  pain  Nausea and vomiting, intractability of vomiting not specified, unspecified vomiting type    New Prescriptions New Prescriptions   IBUPROFEN (ADVIL,MOTRIN) 600 MG TABLET    Take 1 tablet (600 mg total) by mouth every 8 (eight) hours as needed.   ONDANSETRON (ZOFRAN ODT) 8 MG DISINTEGRATING TABLET    Take 1 tablet (8 mg total) by mouth every 8 (eight) hours as needed for nausea or vomiting.     Azalia Bilis, MD 01/20/16 1006

## 2016-01-20 NOTE — ED Triage Notes (Signed)
Pt started to have increased frequency, dark urine and right sided flank pain this am.  Some nausea associated with it.  History of kidney stones.

## 2016-02-10 ENCOUNTER — Encounter (HOSPITAL_BASED_OUTPATIENT_CLINIC_OR_DEPARTMENT_OTHER): Payer: Self-pay | Admitting: Emergency Medicine

## 2016-02-10 ENCOUNTER — Emergency Department (HOSPITAL_BASED_OUTPATIENT_CLINIC_OR_DEPARTMENT_OTHER)
Admission: EM | Admit: 2016-02-10 | Discharge: 2016-02-10 | Disposition: A | Discharge status: Home / Self Care | Payer: Self-pay | Attending: Emergency Medicine | Admitting: Emergency Medicine

## 2016-02-10 DIAGNOSIS — K2970 Gastritis, unspecified, without bleeding: Secondary | ICD-10-CM | POA: Insufficient documentation

## 2016-02-10 DIAGNOSIS — F17210 Nicotine dependence, cigarettes, uncomplicated: Secondary | ICD-10-CM | POA: Insufficient documentation

## 2016-02-10 HISTORY — DX: Irritable bowel syndrome, unspecified: K58.9

## 2016-02-10 HISTORY — DX: Noninfective gastroenteritis and colitis, unspecified: K52.9

## 2016-02-10 LAB — COMPREHENSIVE METABOLIC PANEL
ALK PHOS: 86 U/L (ref 38–126)
ALT: 43 U/L (ref 17–63)
AST: 34 U/L (ref 15–41)
Albumin: 4.1 g/dL (ref 3.5–5.0)
Anion gap: 10 (ref 5–15)
BUN: 12 mg/dL (ref 6–20)
CALCIUM: 9.5 mg/dL (ref 8.9–10.3)
CO2: 27 mmol/L (ref 22–32)
CREATININE: 0.73 mg/dL (ref 0.61–1.24)
Chloride: 99 mmol/L — ABNORMAL LOW (ref 101–111)
GFR calc Af Amer: >60 mL/min (ref >60)
Glucose, Bld: 100 mg/dL — ABNORMAL HIGH (ref 65–99)
Potassium: 4 mmol/L (ref 3.5–5.1)
Sodium: 136 mmol/L (ref 135–145)
Total Bilirubin: 0.5 mg/dL (ref 0.3–1.2)
Total Protein: 7.3 g/dL (ref 6.5–8.1)

## 2016-02-10 LAB — CBC WITH DIFFERENTIAL/PLATELET
Basophils Absolute: 0.1 10*3/uL (ref 0.0–0.1)
Basophils Relative: 1 %
Eosinophils Absolute: 0.4 10*3/uL (ref 0.0–0.7)
Eosinophils Relative: 6 %
HCT: 46.5 % (ref 39.0–52.0)
HEMOGLOBIN: 16.2 g/dL (ref 13.0–17.0)
LYMPHS ABS: 1.7 10*3/uL (ref 0.7–4.0)
LYMPHS PCT: 30 %
MCH: 29.5 pg (ref 26.0–34.0)
MCHC: 34.8 g/dL (ref 30.0–36.0)
MCV: 84.7 fL (ref 78.0–100.0)
Monocytes Absolute: 0.7 10*3/uL (ref 0.1–1.0)
Monocytes Relative: 12 %
NEUTROS PCT: 51 %
Neutro Abs: 2.8 10*3/uL (ref 1.7–7.7)
Platelets: 298 10*3/uL (ref 150–400)
RBC: 5.49 MIL/uL (ref 4.22–5.81)
RDW: 12.5 % (ref 11.5–15.5)
WBC: 5.6 10*3/uL (ref 4.0–10.5)

## 2016-02-10 LAB — LIPASE, BLOOD: Lipase: 15 U/L (ref 11–51)

## 2016-02-10 MED ORDER — GI COCKTAIL ~~LOC~~
30.0000 mL | Freq: Once | ORAL | Status: AC
  Administered 2016-02-10: 30 mL via ORAL
  Filled 2016-02-10: qty 30

## 2016-02-10 MED ORDER — SODIUM CHLORIDE 0.9 % IV BOLUS (SEPSIS)
1000.0000 mL | Freq: Once | INTRAVENOUS | Status: AC
  Administered 2016-02-10: 1000 mL via INTRAVENOUS

## 2016-02-10 MED ORDER — FAMOTIDINE IN NACL 20-0.9 MG/50ML-% IV SOLN
20.0000 mg | Freq: Once | INTRAVENOUS | Status: AC
  Administered 2016-02-10: 20 mg via INTRAVENOUS
  Filled 2016-02-10: qty 50

## 2016-02-10 MED ORDER — FAMOTIDINE 20 MG PO TABS: 20.0000 mg | ORAL_TABLET | Freq: Two times a day (BID) | ORAL | 0 refills | Status: DC

## 2016-02-10 MED ORDER — SUCRALFATE 1 GM/10ML PO SUSP: 1.0000 g | Freq: Three times a day (TID) | ORAL | 0 refills | Status: DC

## 2016-02-10 NOTE — ED Provider Notes (Signed)
[facility]   CSN: [id number] Arrival date & time: 02/10/16  [id number]     History   Chief Complaint Chief Complaint  Patient presents with  . Abdominal Pain    HPI Ricardo Sweeney is a 25 y.o. male.  Patient presents to the emergency department for evaluation of abdominal pain. Patient reports that he has frequent abdominal pain secondary to IBS. He took a Bentyl this morning for upper epigastric pain, but it did not help. Patient has not had associated fever, nausea, vomiting, diarrhea. He had a normal bowel movement this morning. There has not been any rectal bleeding.      Past Medical History:  Diagnosis Date  . Clostridium difficile infection   . Drug-seeking behavior   . IBD (inflammatory bowel disease)   . IBS (irritable bowel syndrome)   . Kidney stone   . Kidney stone   . Multiple gastric ulcers   . Narcotic abuse     Patient Active Problem List   Diagnosis Date Noted  . Left knee injury, initial encounter 12/25/2015  . Nephrolithiasis 12/26/2013    Past Surgical History:  Procedure Laterality Date  . COLONOSCOPY    . kidney stone stent    . LITHOTRIPSY         Home Medications    Prior to Admission medications   Medication Sig Start Date End Date Taking? Authorizing Provider  diclofenac (VOLTAREN) 75 MG EC tablet Take 1 tablet (75 mg total) by mouth 2 (two) times daily. 12/24/15   Lenda KelpShane R Hudnall, MD  ibuprofen (ADVIL,MOTRIN) 600 MG tablet Take 1 tablet (600 mg total) by mouth every 8 (eight) hours as needed. 01/20/16   Azalia BilisKevin Campos, MD  ondansetron (ZOFRAN ODT) 8 MG disintegrating tablet Take 1 tablet (8 mg total) by mouth every 8 (eight) hours as needed for nausea or vomiting. 01/20/16   Azalia BilisKevin Campos, MD    Family History No family history on file.  Social History Social History  Substance Use Topics  . Smoking status: Current Every Day Smoker    Types: E-cigarettes  . Smokeless tobacco: Never Used  . Alcohol use  No     Allergies   Tape and Ultram [tramadol]   Review of Systems Review of Systems  Gastrointestinal: Positive for abdominal pain.  All other systems reviewed and are negative.    Physical Exam Updated Vital Signs BP 122/97 (BP Location: Left Arm)   Pulse 89   Temp 97.8 F (36.6 C) (Oral)   Resp 18   Ht 5\' 9"  (1.753 m)   Wt 243 lb (110.2 kg)   SpO2 97%   BMI 35.88 kg/m   Physical Exam  Constitutional: He is oriented to person, place, and time. He appears well-developed and well-nourished. No distress.  HENT:  Head: Normocephalic and atraumatic.  Right Ear: Hearing normal.  Left Ear: Hearing normal.  Nose: Nose normal.  Mouth/Throat: Oropharynx is clear and moist and mucous membranes are normal.  Eyes: Conjunctivae and EOM are normal. Pupils are equal, round, and reactive to light.  Neck: Normal range of motion. Neck supple.  Cardiovascular: Regular rhythm, S1 normal and S2 normal.  Exam reveals no gallop and no friction rub.   No murmur heard. Pulmonary/Chest: Effort normal and breath sounds normal. No respiratory distress. He exhibits no tenderness.  Abdominal: Soft. Normal appearance and bowel sounds are normal. There is no hepatosplenomegaly. There is tenderness in the epigastric area. There is no rebound, no guarding, no tenderness at  McBurney's point and negative Murphy's sign. No hernia.  Musculoskeletal: Normal range of motion.  Neurological: He is alert and oriented to person, place, and time. He has normal strength. No cranial nerve deficit or sensory deficit. Coordination normal. GCS eye subscore is 4. GCS verbal subscore is 5. GCS motor subscore is 6.  Skin: Skin is warm, dry and intact. No rash noted. No cyanosis.  Psychiatric: He has a normal mood and affect. His speech is normal and behavior is normal. Thought content normal.  Nursing note and vitals reviewed.    ED Treatments / Results  Labs (all labs ordered are listed, but only abnormal results  are displayed) Labs Reviewed  CBC WITH DIFFERENTIAL/PLATELET  COMPREHENSIVE METABOLIC PANEL  LIPASE, BLOOD    EKG  EKG Interpretation None       Radiology No results found.  Procedures Procedures (including critical care time)  Medications Ordered in ED Medications  famotidine (PEPCID) IVPB 20 mg premix (20 mg Intravenous New Bag/Given 02/10/16 16100613)  sodium chloride 0.9 % bolus 1,000 mL (1,000 mLs Intravenous New Bag/Given 02/10/16 [id number])  gi cocktail (Maalox,Lidocaine,Donnatal) (30 mLs Oral Given 02/10/16 [id number])     Initial Impression / Assessment and Plan / ED Course  I have reviewed the triage vital signs and the nursing notes.  Pertinent labs & imaging results that were available during my care of the patient were reviewed by me and considered in my medical decision making (see chart for details).  Clinical Course    Patient presents with complaints of abdominal pain. Reports a history of IBS. He also initially reported he had IBD. I asked him about Crohn's versus ulcerative colitis he did not know what either of them were, and said he had a history of C. difficile. Reviewing his records reveals multiple visits to multiple ERs with multiple narcotic suspension recently. He does carry a history of substance abuse. Patient also reports a history of peptic ulcer disease. His abdominal exam was benign. No concern for perforated viscus. He has not had any rectal bleeding. Vital signs are normal. Patient will be treated for gastritis, discharge. No narcotic analgesia.  Final Clinical Impressions(s) / ED Diagnoses   Final diagnoses:  Gastritis without bleeding, unspecified chronicity, unspecified gastritis type    New Prescriptions New Prescriptions   No medications on file     Gilda Creasehristopher J Chaim Gatley, MD 02/10/16 [phone]

## 2016-02-10 NOTE — ED Triage Notes (Signed)
States he woke up at 0400-0500 this am with generalized abd pain. States he took Bentyl without relief. Describes the pain as sharp and dull, and 5-6/10 in intensity. Denies any other symptoms.

## 2016-02-12 ENCOUNTER — Encounter (HOSPITAL_BASED_OUTPATIENT_CLINIC_OR_DEPARTMENT_OTHER): Payer: Self-pay

## 2016-02-12 ENCOUNTER — Emergency Department (HOSPITAL_BASED_OUTPATIENT_CLINIC_OR_DEPARTMENT_OTHER)
Admission: EM | Admit: 2016-02-12 | Discharge: 2016-02-12 | Disposition: A | Discharge status: Home / Self Care | Payer: Medicaid - Out of State | Attending: Emergency Medicine | Admitting: Emergency Medicine

## 2016-02-12 DIAGNOSIS — R197 Diarrhea, unspecified: Secondary | ICD-10-CM | POA: Diagnosis not present

## 2016-02-12 DIAGNOSIS — F17210 Nicotine dependence, cigarettes, uncomplicated: Secondary | ICD-10-CM | POA: Insufficient documentation

## 2016-02-12 DIAGNOSIS — R112 Nausea with vomiting, unspecified: Secondary | ICD-10-CM

## 2016-02-12 DIAGNOSIS — R1013 Epigastric pain: Secondary | ICD-10-CM | POA: Diagnosis not present

## 2016-02-12 LAB — COMPREHENSIVE METABOLIC PANEL
ALT: 52 U/L (ref 17–63)
AST: 39 U/L (ref 15–41)
Albumin: 4.3 g/dL (ref 3.5–5.0)
Alkaline Phosphatase: 82 U/L (ref 38–126)
Anion gap: 8 (ref 5–15)
BUN: 13 mg/dL (ref 6–20)
CHLORIDE: 101 mmol/L (ref 101–111)
CO2: 27 mmol/L (ref 22–32)
CREATININE: 0.89 mg/dL (ref 0.61–1.24)
Calcium: 9.8 mg/dL (ref 8.9–10.3)
GFR calc Af Amer: >60 mL/min (ref >60)
Glucose, Bld: 112 mg/dL — ABNORMAL HIGH (ref 65–99)
POTASSIUM: 3.7 mmol/L (ref 3.5–5.1)
SODIUM: 136 mmol/L (ref 135–145)
Total Bilirubin: 0.6 mg/dL (ref 0.3–1.2)
Total Protein: 7.6 g/dL (ref 6.5–8.1)

## 2016-02-12 LAB — CBC WITH DIFFERENTIAL/PLATELET
BASOS ABS: 0.1 10*3/uL (ref 0.0–0.1)
BASOS PCT: 1 %
EOS ABS: 0.2 10*3/uL (ref 0.0–0.7)
EOS PCT: 2 %
HCT: 47.1 % (ref 39.0–52.0)
Hemoglobin: 16.5 g/dL (ref 13.0–17.0)
Lymphocytes Relative: 19 %
Lymphs Abs: 1.4 10*3/uL (ref 0.7–4.0)
MCH: 29.5 pg (ref 26.0–34.0)
MCHC: 35 g/dL (ref 30.0–36.0)
MCV: 84.3 fL (ref 78.0–100.0)
MONO ABS: 0.8 10*3/uL (ref 0.1–1.0)
Monocytes Relative: 10 %
Neutro Abs: 5.3 10*3/uL (ref 1.7–7.7)
Neutrophils Relative %: 68 %
PLATELETS: 316 10*3/uL (ref 150–400)
RBC: 5.59 MIL/uL (ref 4.22–5.81)
RDW: 12.6 % (ref 11.5–15.5)
WBC: 7.8 10*3/uL (ref 4.0–10.5)

## 2016-02-12 LAB — URINALYSIS, ROUTINE W REFLEX MICROSCOPIC
Bilirubin Urine: NEGATIVE
GLUCOSE, UA: NEGATIVE mg/dL
Hgb urine dipstick: NEGATIVE
KETONES UR: NEGATIVE mg/dL
LEUKOCYTES UA: NEGATIVE
NITRITE: NEGATIVE
PH: 8 (ref 5.0–8.0)
PROTEIN: NEGATIVE mg/dL
Specific Gravity, Urine: 1.023 (ref 1.005–1.030)

## 2016-02-12 LAB — LIPASE, BLOOD: LIPASE: 20 U/L (ref 11–51)

## 2016-02-12 LAB — OCCULT BLOOD X 1 CARD TO LAB, STOOL: FECAL OCCULT BLD: NEGATIVE

## 2016-02-12 MED ORDER — DEXLANSOPRAZOLE 30 MG PO CPDR: 30.0000 mg | DELAYED_RELEASE_CAPSULE | Freq: Every day | ORAL | 1 refills | Status: DC

## 2016-02-12 MED ORDER — FENTANYL CITRATE (PF) 100 MCG/2ML IJ SOLN
50.0000 ug | Freq: Once | INTRAMUSCULAR | Status: AC
  Administered 2016-02-12: 50 ug via INTRAVENOUS
  Filled 2016-02-12: qty 2

## 2016-02-12 MED ORDER — FAMOTIDINE IN NACL 20-0.9 MG/50ML-% IV SOLN
20.0000 mg | Freq: Once | INTRAVENOUS | Status: AC
  Administered 2016-02-12: 20 mg via INTRAVENOUS
  Filled 2016-02-12: qty 50

## 2016-02-12 MED ORDER — LIDOCAINE VISCOUS 2 % MT SOLN: OROMUCOSAL | 0 refills | Status: DC

## 2016-02-12 MED ORDER — SODIUM CHLORIDE 0.9 % IV BOLUS (SEPSIS)
1000.0000 mL | Freq: Once | INTRAVENOUS | Status: AC
  Administered 2016-02-12: 1000 mL via INTRAVENOUS

## 2016-02-12 MED ORDER — LOPERAMIDE HCL 2 MG PO CAPS: 2.0000 mg | ORAL_CAPSULE | Freq: Four times a day (QID) | ORAL | 0 refills | Status: DC | PRN

## 2016-02-12 MED ORDER — SUCRALFATE 1 G PO TABS: 1.0000 g | ORAL_TABLET | Freq: Three times a day (TID) | ORAL | 1 refills | Status: DC

## 2016-02-12 MED ORDER — ONDANSETRON HCL 4 MG PO TABS: 4.0000 mg | ORAL_TABLET | Freq: Three times a day (TID) | ORAL | 0 refills | Status: DC | PRN

## 2016-02-12 MED ORDER — ONDANSETRON HCL 4 MG/2ML IJ SOLN
4.0000 mg | Freq: Once | INTRAMUSCULAR | Status: AC
  Administered 2016-02-12: 4 mg via INTRAVENOUS
  Filled 2016-02-12: qty 2

## 2016-02-12 NOTE — Discharge Instructions (Signed)
Your lab results are normal and there is no evidence of blood in your stool. You may have a viral gastritis or ulcer. Please follow up with your doctor in the next 2-3 days. Abdominal (belly) pain can be caused by many things. Your caregiver performed an examination and possibly ordered blood/urine tests and imaging (CT scan, x-rays, ultrasound). Many cases can be observed and treated at home after initial evaluation in the emergency department. Even though you are being discharged home, abdominal pain can be unpredictable. Therefore, you need a repeated exam if your pain does not resolve, returns, or worsens. Most patients with abdominal pain don't have to be admitted to the hospital or have surgery, but serious problems like appendicitis and gallbladder attacks can start out as nonspecific pain. Many abdominal conditions cannot be diagnosed in one visit, so follow-up evaluations are very important. SEEK IMMEDIATE MEDICAL ATTENTION IF: The pain does not go away or becomes severe.  A temperature above 101 develops.  Repeated vomiting occurs (multiple episodes).  The pain becomes localized to portions of the abdomen. The right side could possibly be appendicitis. In an adult, the left lower portion of the abdomen could be colitis or diverticulitis.  Blood is being passed in stools or vomit (bright red or black tarry stools).  Return also if you develop chest pain, difficulty breathing, dizziness or fainting, or become confused, poorly responsive, or inconsolable (young children).

## 2016-02-12 NOTE — ED Triage Notes (Signed)
Pt seen here 2 days ago for and pain-c/o bloody stools started yesterday-cont's n/v-NAD-steady gait

## 2016-02-12 NOTE — ED Provider Notes (Signed)
[facility] Provider Note   CSN: [id number] Arrival date & time: 02/12/16  1132     History   Chief Complaint Chief Complaint  Patient presents with  . Rectal Bleeding    HPI Ricardo Sweeney is a 25 y.o. male who presents with cc of abdominal pain and bloody stool. His PMH is listed below and includes drug-seeking behavior. The patient states that over the past 3 days he has had constant for about 10 epigastric pain which is worsened while eating. He states when he eats. It is 10 out of 10. He has seen a GI specialist several years ago, was told that he had multiple peptic ulcers from silent reflux. At that time. He also was diagnosed with C. difficile and treated for both. He is unsure if he had a history of H. pylori. Patient complains of frequent watery stools and yesterday had a significant amount of bleeding. His wife states, "it looked like he had a period in the toilet." The patient has had vomiting of nonbloody, nonbilious vomitus in the past few days. He denies fevers, chills.  HPI  Past Medical History:  Diagnosis Date  . Clostridium difficile infection   . Drug-seeking behavior   . IBD (inflammatory bowel disease)   . IBS (irritable bowel syndrome)   . Kidney stone   . Kidney stone   . Multiple gastric ulcers   . Narcotic abuse     Patient Active Problem List   Diagnosis Date Noted  . Left knee injury, initial encounter 12/25/2015  . Nephrolithiasis 12/26/2013    Past Surgical History:  Procedure Laterality Date  . COLONOSCOPY    . kidney stone stent    . LITHOTRIPSY         Home Medications    Prior to Admission medications   Medication Sig Start Date End Date Taking? Authorizing Provider  ibuprofen (ADVIL,MOTRIN) 600 MG tablet Take 1 tablet (600 mg total) by mouth every 8 (eight) hours as needed. 01/20/16   Azalia BilisKevin Campos, MD    Family History No family history on file.  Social History Social History  Substance Use Topics  . Smoking  status: Current Every Day Smoker    Types: E-cigarettes  . Smokeless tobacco: Never Used  . Alcohol use No     Allergies   Tape and Ultram [tramadol]   Review of Systems Review of Systems  Ten systems reviewed and are negative for acute change, except as noted in the HPI.   Physical Exam Updated Vital Signs BP 110/79 (BP Location: Left Arm)   Pulse (!) 122   Temp 98.6 F (37 C) (Oral)   Resp 20   Ht 5\' 9"  (1.753 m)   Wt 108.4 kg   SpO2 98%   BMI 35.29 kg/m   Physical Exam  Constitutional: He is oriented to person, place, and time. He appears well-developed and well-nourished. No distress.  HENT:  Head: Normocephalic and atraumatic.  Eyes: Conjunctivae are normal. No scleral icterus.  Neck: Normal range of motion. Neck supple.  Cardiovascular: Normal rate, regular rhythm and normal heart sounds.   Pulmonary/Chest: Effort normal and breath sounds normal. No respiratory distress.  Abdominal: Soft. He exhibits no distension and no mass. There is tenderness (epigastrium). There is no guarding.  Genitourinary:  Genitourinary Comments: Digital Rectal Exam reveals sphincter with good tone. No external hemorrhoids. No masses or fissures. Stool color is brown with no overt blood.   Musculoskeletal: He exhibits no edema.  Neurological: He is  alert and oriented to person, place, and time.  Skin: Skin is warm and dry. He is not diaphoretic.  Psychiatric: His behavior is normal.  Nursing note and vitals reviewed.    ED Treatments / Results  Labs (all labs ordered are listed, but only abnormal results are displayed) Labs Reviewed  CBC WITH DIFFERENTIAL/PLATELET  COMPREHENSIVE METABOLIC PANEL  LIPASE, BLOOD  URINALYSIS, ROUTINE W REFLEX MICROSCOPIC  OCCULT BLOOD X 1 CARD TO LAB, STOOL    EKG  EKG Interpretation None       Radiology No results found.  Procedures Procedures (including critical care time)  Medications Ordered in ED Medications  sodium chloride  0.9 % bolus 1,000 mL (0 mLs Intravenous Stopped 02/12/16 1411)  fentaNYL (SUBLIMAZE) injection 50 mcg (50 mcg Intravenous Given 02/12/16 1240)  ondansetron (ZOFRAN) injection 4 mg (4 mg Intravenous Given 02/12/16 1240)  famotidine (PEPCID) IVPB 20 mg premix (0 mg Intravenous Stopped 02/12/16 1322)     Initial Impression / Assessment and Plan / ED Course  I have reviewed the triage vital signs and the nursing notes.  Pertinent labs & imaging results that were available during my care of the patient were reviewed by me and considered in my medical decision making (see chart for details).  Clinical Course    Patient had tachycardia resolved with fluids. He appears comfortable. He does ambulatory without orthostasis. Patient states that his pain is resolved. We will discharge him with nonnarcotic interventions. Potentially this is viral gastroenteritis versus peptic ulcer disease. Tachycardia could also have been secondary to withdrawal. Patient appears improved and safe for discharge at this time. I discussed return precautions with the patient.  Final Clinical Impressions(s) / ED Diagnoses   Final diagnoses:  Epigastric pain  Nausea vomiting and diarrhea    New Prescriptions New Prescriptions   No medications on file     Arthor Captainbigail Maleta Pacha, PA-C 02/12/16 1616    Marily MemosJason Mesner, MD 02/13/16 [phone]

## 2016-09-01 ENCOUNTER — Emergency Department (HOSPITAL_BASED_OUTPATIENT_CLINIC_OR_DEPARTMENT_OTHER)
Admission: EM | Admit: 2016-09-01 | Discharge: 2016-09-01 | Disposition: A | Discharge status: Home / Self Care | Payer: Medicaid Other | Attending: Emergency Medicine | Admitting: Emergency Medicine

## 2016-09-01 ENCOUNTER — Encounter (HOSPITAL_BASED_OUTPATIENT_CLINIC_OR_DEPARTMENT_OTHER): Payer: Self-pay

## 2016-09-01 ENCOUNTER — Emergency Department (HOSPITAL_BASED_OUTPATIENT_CLINIC_OR_DEPARTMENT_OTHER): Payer: Medicaid Other

## 2016-09-01 DIAGNOSIS — Z87442 Personal history of urinary calculi: Secondary | ICD-10-CM | POA: Insufficient documentation

## 2016-09-01 DIAGNOSIS — F17220 Nicotine dependence, chewing tobacco, uncomplicated: Secondary | ICD-10-CM | POA: Diagnosis not present

## 2016-09-01 DIAGNOSIS — R1011 Right upper quadrant pain: Secondary | ICD-10-CM | POA: Insufficient documentation

## 2016-09-01 DIAGNOSIS — R109 Unspecified abdominal pain: Secondary | ICD-10-CM

## 2016-09-01 LAB — URINALYSIS, ROUTINE W REFLEX MICROSCOPIC
Bilirubin Urine: NEGATIVE
Glucose, UA: NEGATIVE mg/dL
Ketones, ur: NEGATIVE mg/dL
LEUKOCYTES UA: NEGATIVE
NITRITE: NEGATIVE
PH: 8.5 — AB (ref 5.0–8.0)
Protein, ur: NEGATIVE mg/dL
SPECIFIC GRAVITY, URINE: 1.025 (ref 1.005–1.030)

## 2016-09-01 LAB — BASIC METABOLIC PANEL
Anion gap: 10 (ref 5–15)
BUN: 12 mg/dL (ref 6–20)
CALCIUM: 8.9 mg/dL (ref 8.9–10.3)
CHLORIDE: 106 mmol/L (ref 101–111)
CO2: 25 mmol/L (ref 22–32)
CREATININE: 0.87 mg/dL (ref 0.61–1.24)
GFR calc Af Amer: >60 mL/min (ref >60)
GFR calc non Af Amer: >60 mL/min (ref >60)
Glucose, Bld: 97 mg/dL (ref 65–99)
Potassium: 4.1 mmol/L (ref 3.5–5.1)
SODIUM: 141 mmol/L (ref 135–145)

## 2016-09-01 LAB — CBC
HCT: 47.1 % (ref 39.0–52.0)
Hemoglobin: 16.6 g/dL (ref 13.0–17.0)
MCH: 30 pg (ref 26.0–34.0)
MCHC: 35.2 g/dL (ref 30.0–36.0)
MCV: 85 fL (ref 78.0–100.0)
Platelets: 332 10*3/uL (ref 150–400)
RBC: 5.54 MIL/uL (ref 4.22–5.81)
RDW: 12.7 % (ref 11.5–15.5)
WBC: 7.4 10*3/uL (ref 4.0–10.5)

## 2016-09-01 LAB — URINALYSIS, MICROSCOPIC (REFLEX)

## 2016-09-01 MED ORDER — ONDANSETRON 8 MG PO TBDP: 8.0000 mg | ORAL_TABLET | Freq: Three times a day (TID) | ORAL | 0 refills | Status: DC | PRN

## 2016-09-01 MED ORDER — METOCLOPRAMIDE HCL 5 MG/ML IJ SOLN
10.0000 mg | Freq: Once | INTRAMUSCULAR | Status: AC
  Administered 2016-09-01: 10 mg via INTRAVENOUS
  Filled 2016-09-01: qty 2

## 2016-09-01 MED ORDER — DEXTROSE 5 % IV SOLN
1.0000 g | Freq: Once | INTRAVENOUS | Status: AC
  Administered 2016-09-01: 1 g via INTRAVENOUS
  Filled 2016-09-01: qty 10

## 2016-09-01 MED ORDER — SODIUM CHLORIDE 0.9 % IV BOLUS (SEPSIS)
1000.0000 mL | Freq: Once | INTRAVENOUS | Status: AC
  Administered 2016-09-01: 1000 mL via INTRAVENOUS

## 2016-09-01 MED ORDER — OXYCODONE-ACETAMINOPHEN 5-325 MG PO TABS: 2.0000 | ORAL_TABLET | ORAL | 0 refills | Status: DC | PRN

## 2016-09-01 MED ORDER — TAMSULOSIN HCL 0.4 MG PO CAPS: 0.4000 mg | ORAL_CAPSULE | Freq: Every day | ORAL | 0 refills | Status: DC

## 2016-09-01 MED ORDER — MORPHINE SULFATE (PF) 4 MG/ML IV SOLN
4.0000 mg | Freq: Once | INTRAVENOUS | Status: AC
  Administered 2016-09-01: 4 mg via INTRAVENOUS
  Filled 2016-09-01: qty 1

## 2016-09-01 MED ORDER — KETOROLAC TROMETHAMINE 30 MG/ML IJ SOLN
15.0000 mg | Freq: Once | INTRAMUSCULAR | Status: AC
  Administered 2016-09-01: 15 mg via INTRAVENOUS
  Filled 2016-09-01: qty 1

## 2016-09-01 MED ORDER — FENTANYL CITRATE (PF) 100 MCG/2ML IJ SOLN
25.0000 ug | Freq: Once | INTRAMUSCULAR | Status: AC
  Administered 2016-09-01: 25 ug via INTRAVENOUS
  Filled 2016-09-01: qty 2

## 2016-09-01 MED ORDER — FENTANYL CITRATE (PF) 100 MCG/2ML IJ SOLN
50.0000 ug | Freq: Once | INTRAMUSCULAR | Status: AC
  Administered 2016-09-01: 50 ug via INTRAVENOUS
  Filled 2016-09-01: qty 2

## 2016-09-01 MED ORDER — HYDROCODONE-ACETAMINOPHEN 5-325 MG PO TABS
1.0000 | ORAL_TABLET | Freq: Once | ORAL | Status: AC
  Administered 2016-09-01: 1 via ORAL
  Filled 2016-09-01: qty 1

## 2016-09-01 MED ORDER — CEPHALEXIN 500 MG PO CAPS: 500.0000 mg | ORAL_CAPSULE | Freq: Two times a day (BID) | ORAL | 0 refills | Status: DC

## 2016-09-01 NOTE — ED Provider Notes (Signed)
[facility]   CSN: [id number] Arrival date & time: 09/01/16  1235     History   Chief Complaint Chief Complaint  Patient presents with  . Flank Pain    HPI Ricardo Sweeney is a 26 y.o. male.  HPI 26 year old Caucasian male past medical history significant for recurrent kidney stones presents to the emergency Department today with complaints of right flank pain. Patient states that he has had "60 kidney stones in the past 2 years". He is followed by urology. States he has had several lithotripsies and ablations for kidney stones. States this feels similar to his prior kidney stones. He also reports dysuria, hematuria, urgency and frequency. Pain radiates to his right side. Reports associated nausea and emesis. Has not tried nothing for his symptoms at home prior to arrival. Nothing makes better or worse. Patient denies any fever, chills, headache, vision changes, abdominal pain, testicular pain, testicular swelling, penile discharge, change in bowel habits. Past Medical History:  Diagnosis Date  . Clostridium difficile infection   . Drug-seeking behavior   . IBD (inflammatory bowel disease)   . IBS (irritable bowel syndrome)   . Kidney stone   . Kidney stone   . Multiple gastric ulcers   . Narcotic abuse     Patient Active Problem List   Diagnosis Date Noted  . Left knee injury, initial encounter 12/25/2015  . Nephrolithiasis 12/26/2013    Past Surgical History:  Procedure Laterality Date  . COLONOSCOPY    . kidney stone stent    . LITHOTRIPSY         Home Medications    Prior to Admission medications   Medication Sig Start Date End Date Taking? Authorizing Provider  cephALEXin (KEFLEX) 500 MG capsule Take 1 capsule (500 mg total) by mouth 2 (two) times daily. 09/01/16   Demetrios Loll T, PA-C  ondansetron (ZOFRAN-ODT) 8 MG disintegrating tablet Take 1 tablet (8 mg total) by mouth every 8 (eight) hours as needed for nausea. 09/01/16    Rise Mu, PA-C  oxyCODONE-acetaminophen (PERCOCET/ROXICET) 5-325 MG tablet Take 2 tablets by mouth every 4 (four) hours as needed for severe pain. 09/01/16   Rise Mu, PA-C  tamsulosin (FLOMAX) 0.4 MG CAPS capsule Take 1 capsule (0.4 mg total) by mouth daily. 09/01/16   Rise Mu, PA-C    Family History No family history on file.  Social History Social History  Substance Use Topics  . Smoking status: Current Every Day Smoker  . Smokeless tobacco: Current User    Types: Chew  . Alcohol use No     Allergies   Tape and Ultram [tramadol]   Review of Systems Review of Systems  Constitutional: Negative for chills and fever.  HENT: Negative for congestion and sore throat.   Eyes: Negative for visual disturbance.  Respiratory: Negative for cough and shortness of breath.   Cardiovascular: Negative for chest pain.  Gastrointestinal: Negative for abdominal pain, diarrhea, nausea and vomiting.  Genitourinary: Positive for dysuria, flank pain, frequency, hematuria and urgency. Negative for scrotal swelling and testicular pain.  Musculoskeletal: Negative for arthralgias and myalgias.  Skin: Negative for rash.  Neurological: Negative for dizziness, syncope, weakness, light-headedness, numbness and headaches.  Psychiatric/Behavioral: Negative for sleep disturbance. The patient is not nervous/anxious.      Physical Exam Updated Vital Signs BP 104/70   Pulse 67   Temp 98.3 F (36.8 C) (Oral)   Resp 18   Ht 5\' 9"  (1.753 m)  Wt 103.4 kg (228 lb)   SpO2 100%   BMI 33.67 kg/m   Physical Exam  Constitutional: He is oriented to person, place, and time. He appears well-developed and well-nourished.  Non-toxic appearance. No distress.  HENT:  Head: Normocephalic and atraumatic.  Mouth/Throat: Oropharynx is clear and moist.  Eyes: Conjunctivae are normal. Pupils are equal, round, and reactive to light. Right eye exhibits no discharge. Left eye exhibits no  discharge.  Neck: Normal range of motion. Neck supple.  Cardiovascular: Normal rate, regular rhythm, normal heart sounds and intact distal pulses.  Exam reveals no gallop and no friction rub.   No murmur heard. Pulmonary/Chest: Effort normal and breath sounds normal. No respiratory distress. He exhibits no tenderness.  Abdominal: Soft. Bowel sounds are normal. He exhibits no distension. There is no tenderness. There is no rebound and no guarding.  Right-sided CVA tenderness.  Musculoskeletal: Normal range of motion. He exhibits no tenderness.  Lymphadenopathy:    He has no cervical adenopathy.  Neurological: He is alert and oriented to person, place, and time.  Skin: Skin is warm and dry. Capillary refill takes less than 2 seconds. No rash noted.  Psychiatric: His behavior is normal. Judgment and thought content normal.  Nursing note and vitals reviewed.    ED Treatments / Results  Labs (all labs ordered are listed, but only abnormal results are displayed) Labs Reviewed  URINALYSIS, ROUTINE W REFLEX MICROSCOPIC - Abnormal; Notable for the following:       Result Value   APPearance TURBID (*)    pH 8.5 (*)    Hgb urine dipstick LARGE (*)    All other components within normal limits  URINALYSIS, MICROSCOPIC (REFLEX) - Abnormal; Notable for the following:    Bacteria, UA MANY (*)    Squamous Epithelial / LPF 0-5 (*)    All other components within normal limits  URINE CULTURE  BASIC METABOLIC PANEL  CBC    EKG  EKG Interpretation None       Radiology [place]al  Result Date: 09/01/2016 CLINICAL DATA:  Right flank pain, history of nephrolithiasis, prior lithotripsy ureteral stents EXAM: RENAL / URINARY TRACT ULTRASOUND COMPLETE COMPARISON:  01/20/2016, 11/28/2015 FINDINGS: Right Kidney: Length: 10.1 cm. Normal echogenicity and cortical thickness. No hydronephrosis. Small echogenic nonobstructing intrarenal calculus in the lower pole measures 5 mm. No other significant  abnormality. Left Kidney: Length: 10.3 cm. Normal echogenicity and cortical thickness. No hydronephrosis. Small echogenic intrarenal calculus suspected in the midpole measures 3 mm. Bladder: Dependent debris within the bladder noted, nonspecific. No other significant abnormality. Ureteral jets not demonstrated. IMPRESSION: Negative for hydronephrosis. Suspect punctate subcentimeter nonobstructing intrarenal calculi bilaterally. Electronically Signed   By: Judie PetitM.  Shick M.D.   On: 09/01/2016 16:12    Procedures Procedures (including critical care time)  Medications Ordered in ED Medications  cefTRIAXone (ROCEPHIN) 1 g in dextrose 5 % 50 mL IVPB (1 g Intravenous New Bag/Given 09/01/16 1658)  HYDROcodone-acetaminophen (NORCO/VICODIN) 5-325 MG per tablet 1 tablet (not administered)  fentaNYL (SUBLIMAZE) injection 25 mcg (not administered)  ketorolac (TORADOL) 30 MG/ML injection 15 mg (15 mg Intravenous Given 09/01/16 1455)  morphine 4 MG/ML injection 4 mg (4 mg Intravenous Given 09/01/16 1454)  metoCLOPramide (REGLAN) injection 10 mg (10 mg Intravenous Given 09/01/16 1454)  sodium chloride 0.9 % bolus 1,000 mL (1,000 mLs Intravenous New Bag/Given 09/01/16 1454)  fentaNYL (SUBLIMAZE) injection 50 mcg (50 mcg Intravenous Given 09/01/16 1631)     Initial Impression / Assessment and Plan /  ED Course  I have reviewed the triage vital signs and the nursing notes.  Pertinent labs & imaging results that were available during my care of the patient were reviewed by me and considered in my medical decision making (see chart for details).     Patient presents to emergency room today with complaints of right flank pain. History of kidney stones and states this feels similar. Patient is overall well-appearing and nontoxic. Vital signs are reassuring. Patient is not febrile. No tachycardia or hypotension. UA shows moderate amount of hemoglobin and RBCs. Does show many bacteria. No leukocytosis is noted. Creatinine is  normal. The patient has had several CT scans in the past 2 years. Do not feel that CT scan is indicated at this time. Patient's symptoms seem consistent with nephrolithiasis. Did order a renal ultrasound that shows no significant hydronephrosis. Does note some punctate stones in the renal poles bilaterally. Many bacteria was seen in the urine. Urine culture was sent. Patient started on antibiotics and given dose of IV antibiotics in the ED. Pain is been adequately controlled in the ED. Gait function is normal. Patient was reassessed and feels much improved after medications. Able tolerate by mouth fluids or difficulties. Did speak with Dr. Vinie Sill with [facility] urology at [facility]. Will see patient in office tomorrow.  Pt is hemodynamically stable, in NAD, & able to ambulate in the ED. Evaluation does not show pathology that would require ongoing emergent intervention or inpatient treatment. I explained the diagnosis to the patient. Pain has been managed & has no complaints prior to dc. Pt is comfortable with above plan and is stable for discharge at this time. All questions were answered prior to disposition. Strict return precautions for f/u to the ED were discussed.   Final Clinical Impressions(s) / ED Diagnoses   Final diagnoses:  Right flank pain    New Prescriptions New Prescriptions   CEPHALEXIN (KEFLEX) 500 MG CAPSULE    Take 1 capsule (500 mg total) by mouth 2 (two) times daily.   ONDANSETRON (ZOFRAN-ODT) 8 MG DISINTEGRATING TABLET    Take 1 tablet (8 mg total) by mouth every 8 (eight) hours as needed for nausea.   OXYCODONE-ACETAMINOPHEN (PERCOCET/ROXICET) 5-325 MG TABLET    Take 2 tablets by mouth every 4 (four) hours as needed for severe pain.   TAMSULOSIN (FLOMAX) 0.4 MG CAPS CAPSULE    Take 1 capsule (0.4 mg total) by mouth daily.     Rise Mu, PA-C 09/01/16 1730    Tegeler, Canary Brim, MD 09/02/16 [phone]

## 2016-09-01 NOTE — ED Triage Notes (Signed)
C/o right flank pain x today-hx of kidney stone "60 in 2 years"-NAD-steady gait

## 2016-09-01 NOTE — Discharge Instructions (Signed)
This is likely a kidney stone causing her pain. Take the medication as prescribed. Please call your urologist in the morning to schedule an appointment tomorrow. It is important that he return to the ED if he develops any worsening symptoms including fever, worsening pain, worsening vomiting or for any reason.

## 2016-09-02 LAB — URINE CULTURE

## 2016-09-18 ENCOUNTER — Emergency Department (HOSPITAL_BASED_OUTPATIENT_CLINIC_OR_DEPARTMENT_OTHER)
Admission: EM | Admit: 2016-09-18 | Discharge: 2016-09-18 | Disposition: A | Discharge status: Home / Self Care | Payer: Medicaid Other | Attending: Emergency Medicine | Admitting: Emergency Medicine

## 2016-09-18 ENCOUNTER — Encounter (HOSPITAL_BASED_OUTPATIENT_CLINIC_OR_DEPARTMENT_OTHER): Payer: Self-pay | Admitting: *Deleted

## 2016-09-18 ENCOUNTER — Emergency Department (HOSPITAL_BASED_OUTPATIENT_CLINIC_OR_DEPARTMENT_OTHER): Payer: Medicaid Other

## 2016-09-18 DIAGNOSIS — R197 Diarrhea, unspecified: Secondary | ICD-10-CM | POA: Diagnosis present

## 2016-09-18 DIAGNOSIS — F17200 Nicotine dependence, unspecified, uncomplicated: Secondary | ICD-10-CM | POA: Diagnosis not present

## 2016-09-18 DIAGNOSIS — R1030 Lower abdominal pain, unspecified: Secondary | ICD-10-CM | POA: Diagnosis not present

## 2016-09-18 DIAGNOSIS — Z79899 Other long term (current) drug therapy: Secondary | ICD-10-CM | POA: Diagnosis not present

## 2016-09-18 DIAGNOSIS — R112 Nausea with vomiting, unspecified: Secondary | ICD-10-CM | POA: Insufficient documentation

## 2016-09-18 LAB — CBC WITH DIFFERENTIAL/PLATELET
Basophils Absolute: 0 10*3/uL (ref 0.0–0.1)
Basophils Relative: 0 %
EOS ABS: 0.2 10*3/uL (ref 0.0–0.7)
EOS PCT: 2 %
HCT: 51.4 % (ref 39.0–52.0)
HEMOGLOBIN: 18.5 g/dL — AB (ref 13.0–17.0)
LYMPHS ABS: 1.5 10*3/uL (ref 0.7–4.0)
Lymphocytes Relative: 19 %
MCH: 30 pg (ref 26.0–34.0)
MCHC: 36 g/dL (ref 30.0–36.0)
MCV: 83.4 fL (ref 78.0–100.0)
MONOS PCT: 8 %
Monocytes Absolute: 0.6 10*3/uL (ref 0.1–1.0)
Neutro Abs: 5.6 10*3/uL (ref 1.7–7.7)
Neutrophils Relative %: 71 %
PLATELETS: 389 10*3/uL (ref 150–400)
RBC: 6.16 MIL/uL — ABNORMAL HIGH (ref 4.22–5.81)
RDW: 13.1 % (ref 11.5–15.5)
WBC: 7.9 10*3/uL (ref 4.0–10.5)

## 2016-09-18 LAB — COMPREHENSIVE METABOLIC PANEL
ALT: 49 U/L (ref 17–63)
AST: 40 U/L (ref 15–41)
Albumin: 4.9 g/dL (ref 3.5–5.0)
Alkaline Phosphatase: 116 U/L (ref 38–126)
Anion gap: 14 (ref 5–15)
BUN: 7 mg/dL (ref 6–20)
CHLORIDE: 100 mmol/L — AB (ref 101–111)
CO2: 26 mmol/L (ref 22–32)
CREATININE: 0.98 mg/dL (ref 0.61–1.24)
Calcium: 10.4 mg/dL — ABNORMAL HIGH (ref 8.9–10.3)
GFR calc Af Amer: >60 mL/min (ref >60)
Glucose, Bld: 133 mg/dL — ABNORMAL HIGH (ref 65–99)
Potassium: 3.8 mmol/L (ref 3.5–5.1)
Sodium: 140 mmol/L (ref 135–145)
Total Bilirubin: 0.6 mg/dL (ref 0.3–1.2)
Total Protein: 9.3 g/dL — ABNORMAL HIGH (ref 6.5–8.1)

## 2016-09-18 LAB — URINALYSIS, ROUTINE W REFLEX MICROSCOPIC
Glucose, UA: NEGATIVE mg/dL
HGB URINE DIPSTICK: NEGATIVE
Ketones, ur: 15 mg/dL — AB
Leukocytes, UA: NEGATIVE
Nitrite: NEGATIVE
Protein, ur: NEGATIVE mg/dL
SPECIFIC GRAVITY, URINE: 1.025 (ref 1.005–1.030)
pH: 5.5 (ref 5.0–8.0)

## 2016-09-18 MED ORDER — IOPAMIDOL (ISOVUE-300) INJECTION 61%
100.0000 mL | Freq: Once | INTRAVENOUS | Status: AC | PRN
  Administered 2016-09-18: 100 mL via INTRAVENOUS

## 2016-09-18 MED ORDER — DICYCLOMINE HCL 10 MG/ML IM SOLN
20.0000 mg | Freq: Once | INTRAMUSCULAR | Status: AC
  Administered 2016-09-18: 20 mg via INTRAMUSCULAR
  Filled 2016-09-18: qty 2

## 2016-09-18 MED ORDER — SODIUM CHLORIDE 0.9 % IV BOLUS (SEPSIS)
1000.0000 mL | Freq: Once | INTRAVENOUS | Status: AC
Start: 2016-09-18 — End: 2016-09-18
  Administered 2016-09-18: 1000 mL via INTRAVENOUS

## 2016-09-18 MED ORDER — DICYCLOMINE HCL 20 MG PO TABS: 20.0000 mg | ORAL_TABLET | Freq: Two times a day (BID) | ORAL | 0 refills | Status: DC

## 2016-09-18 MED ORDER — ONDANSETRON 4 MG PO TBDP
4.0000 mg | ORAL_TABLET | Freq: Once | ORAL | Status: AC
  Administered 2016-09-18: 4 mg via ORAL
  Filled 2016-09-18: qty 1

## 2016-09-18 MED ORDER — MORPHINE SULFATE (PF) 2 MG/ML IV SOLN
2.0000 mg | Freq: Once | INTRAVENOUS | Status: AC
  Administered 2016-09-18: 2 mg via INTRAVENOUS
  Filled 2016-09-18: qty 1

## 2016-09-18 MED ORDER — MORPHINE SULFATE (PF) 4 MG/ML IV SOLN
4.0000 mg | Freq: Once | INTRAVENOUS | Status: AC
  Administered 2016-09-18: 4 mg via INTRAVENOUS
  Filled 2016-09-18: qty 1

## 2016-09-18 MED ORDER — ONDANSETRON HCL 4 MG PO TABS: 4.0000 mg | ORAL_TABLET | Freq: Four times a day (QID) | ORAL | 0 refills | Status: DC

## 2016-09-18 MED ORDER — ONDANSETRON HCL 4 MG/2ML IJ SOLN
4.0000 mg | Freq: Once | INTRAMUSCULAR | Status: AC
Start: 2016-09-18 — End: 2016-09-18
  Administered 2016-09-18: 4 mg via INTRAVENOUS
  Filled 2016-09-18: qty 2

## 2016-09-18 MED ORDER — PROMETHAZINE HCL 25 MG/ML IJ SOLN
12.5000 mg | Freq: Once | INTRAMUSCULAR | Status: AC
  Administered 2016-09-18: 12.5 mg via INTRAVENOUS
  Filled 2016-09-18: qty 1

## 2016-09-18 NOTE — Discharge Instructions (Signed)
Take zofran as needed for nausea.   Take the bentyl as directed for abdominal cramping.   As we discussed coming sure you're drinking plenty of fluids and staying hydrated. Each plan foods, such as bananas, rice, applesauce, toast, that will help with the diarrhea and improve your symptoms.  Follow-up with your primary care doctor next 24-48 hours further evaluation.  Return the emergency Department for any worsening abdominal pain, fever, persistent vomiting, inability keep any food down, blood in urine, pain with urination, blood in her diarrhea or any other worsening or concerning symptoms.

## 2016-09-18 NOTE — ED Provider Notes (Signed)
[facility] Provider Note   CSN: [id number] Arrival date & time: 09/18/16  1722     History   Chief Complaint Chief Complaint  Patient presents with  . Diarrhea    HPI Ricardo Sweeney is a 26 y.o. male who presents to emergency department tonight days of nausea/vomiting/diarrhea and lower abdominal pain. Patient reports that lower abdominal pain has been constant and describes it as a "sharp shooting pain" to the middle lower region of his stomach. He states that pain is worsened with eating and break before he has a bowel movement. He reports that pain is mildly improved if he lays in the fetal position. Patient reports multiple episodes of diarrhea since onset. He states that still is very loose and watery but denies any presence of blood or black tarry stools. He denies any recent travel outside the country, any recent antibiotics. Patient reports he has had multiple episodes of vomiting since onset. Emesis is nonbloody, nonbilious. He has not been able to tolerate any by mouth since this morning. 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 Feels in the house is sick with similar symptoms. Patient denies any history of abdominal surgery. Patient reports that he has had subjective fevers/chills but has not measured a temperature. Patient denies any chest pain, difficulty breathing, dysuria, hematuria, penile pain, testicular pain or swelling.  HPI  Past Medical History:  Diagnosis Date  . Clostridium difficile infection   . Drug-seeking behavior   . IBD (inflammatory bowel disease)   . IBS (irritable bowel syndrome)   . Kidney stone   . Kidney stone   . Multiple gastric ulcers   . Narcotic abuse     Patient Active Problem List   Diagnosis Date Noted  . Left knee injury, initial encounter 12/25/2015  . Nephrolithiasis 12/26/2013    Past Surgical History:  Procedure Laterality Date  . COLONOSCOPY      . kidney stone stent    . LITHOTRIPSY         Home Medications    Prior to Admission medications   Medication Sig Start Date End Date Taking? Authorizing Provider  cephALEXin (KEFLEX) 500 MG capsule Take 1 capsule (500 mg total) by mouth 2 (two) times daily. 09/01/16   Rise MuLeaphart, Kenneth T, PA-C  dicyclomine (BENTYL) 20 MG tablet Take 1 tablet (20 mg total) by mouth 2 (two) times daily. 09/18/16   Maxwell CaulLayden, Muaz Shorey A, PA-C  ondansetron (ZOFRAN) 4 MG tablet Take 1 tablet (4 mg total) by mouth every 6 (six) hours. 09/18/16   Maxwell CaulLayden, Giulio Bertino A, PA-C  ondansetron (ZOFRAN-ODT) 8 MG disintegrating tablet Take 1 tablet (8 mg total) by mouth every 8 (eight) hours as needed for nausea. 09/01/16   Rise MuLeaphart, Kenneth T, PA-C  oxyCODONE-acetaminophen (PERCOCET/ROXICET) 5-325 MG tablet Take 2 tablets by mouth every 4 (four) hours as needed for severe pain. 09/01/16   Rise MuLeaphart, Kenneth T, PA-C  tamsulosin (FLOMAX) 0.4 MG CAPS capsule Take 1 capsule (0.4 mg total) by mouth daily. 09/01/16   Rise MuLeaphart, Kenneth T, PA-C    Family History History reviewed. No pertinent family history.  Social History Social History  Substance Use Topics  . Smoking status: Current Every Day Smoker  . Smokeless tobacco: Current User    Types: Chew  . Alcohol use No     Allergies   Tape and Ultram [tramadol]   Review of Systems Review of Systems  Constitutional: Positive  for chills and fever (subjective).  HENT: Negative for congestion, rhinorrhea and sore throat.   Respiratory: Negative for cough and shortness of breath.   Cardiovascular: Negative for chest pain.  Gastrointestinal: Positive for abdominal pain, diarrhea, nausea and vomiting. Negative for blood in stool.  Genitourinary: Negative for dysuria, hematuria, penile pain, scrotal swelling and testicular pain.  Neurological: Negative for dizziness, weakness, numbness and headaches.  All other systems reviewed and are negative.    Physical Exam Updated  Vital Signs BP 124/83   Pulse 94   Temp 97.9 F (36.6 C) (Oral)   Resp 11   Ht 5\' 9"  (1.753 m)   Wt 99.8 kg (220 lb)   SpO2 99%   BMI 32.49 kg/m   Physical Exam  Constitutional: He is oriented to person, place, and time. He appears well-developed and well-nourished.  Appears uncomfortable but no acute distress   HENT:  Head: Normocephalic and atraumatic.  Mouth/Throat: Oropharynx is clear and moist. Mucous membranes are dry.  Eyes: Pupils are equal, round, and reactive to light. Conjunctivae, EOM and lids are normal.  Neck: Full passive range of motion without pain.  Cardiovascular: Regular rhythm, normal heart sounds and normal pulses.  Tachycardia present.  Exam reveals no gallop and no friction rub.   No murmur heard. Pulmonary/Chest: Effort normal and breath sounds normal.  Abdominal: Soft. Normal appearance. He exhibits no distension. Bowel sounds are decreased. There is tenderness in the right lower quadrant, suprapubic area and left lower quadrant. There is no rigidity, no rebound, no guarding, no CVA tenderness and no tenderness at McBurney's point.  Abdomen is soft, nondistended. He has diffuse tenderness overlying the entire lower abdomen region. Negative Rovsing's, negative rebound. No McBurney's point tenderness. No CVA tenderness bilaterally. Negative psoas sign. Negative heel tap sign.  Musculoskeletal: Normal range of motion.  Neurological: He is alert and oriented to person, place, and time.  Skin: Skin is warm and dry. Capillary refill takes less than 2 seconds.  Psychiatric: He has a normal mood and affect. His speech is normal.  Nursing note and vitals reviewed.    ED Treatments / Results  Labs (all labs ordered are listed, but only abnormal results are displayed) Labs Reviewed  COMPREHENSIVE METABOLIC PANEL - Abnormal; Notable for the following:       Result Value   Chloride 100 (*)    Glucose, Bld 133 (*)    Calcium 10.4 (*)    Total Protein 9.3 (*)     All other components within normal limits  CBC WITH DIFFERENTIAL/PLATELET - Abnormal; Notable for the following:    RBC 6.16 (*)    Hemoglobin 18.5 (*)    All other components within normal limits  URINALYSIS, ROUTINE W REFLEX MICROSCOPIC - Abnormal; Notable for the following:    Color, Urine AMBER (*)    Bilirubin Urine SMALL (*)    Ketones, ur 15 (*)    All other components within normal limits    EKG  EKG Interpretation None       Radiology Ct Abdomen Pelvis W Contrast  Result Date: 09/18/2016 CLINICAL DATA:  Diarrhea and vomiting for 3 days EXAM: CT ABDOMEN AND PELVIS WITH CONTRAST TECHNIQUE: Multidetector CT imaging of the abdomen and pelvis was performed using the standard protocol following bolus administration of intravenous contrast. CONTRAST:  100mL ISOVUE-300 IOPAMIDOL (ISOVUE-300) INJECTION 61% COMPARISON:  09/01/2016 ultrasound, CT 01/20/2016 FINDINGS: Lower chest: Lung bases demonstrate no acute consolidation or pleural effusion. Heart size is within normal limits.  Hepatobiliary: Probable focal fat infiltration near the falciform ligament. No calcified gallstones or biliary dilatation Pancreas: Unremarkable. No pancreatic ductal dilatation or surrounding inflammatory changes. Spleen: Normal in size without focal abnormality. Adrenals/Urinary Tract: Negative for hydronephrosis. Punctate nonobstructing stones in the right kidney. Bladder normal Stomach/Bowel: Stomach is within normal limits. Appendix appears normal. No evidence of bowel wall thickening, distention, or inflammatory changes. Mild sigmoid colon diverticula without acute inflammation Vascular/Lymphatic: Non aneurysmal aorta. Prominent right lower quadrant mesenteric lymph nodes measuring up to 7 mm. Reproductive: Prostate is unremarkable. Other: No free air or free fluid. Musculoskeletal: No acute or significant osseous findings. IMPRESSION: 1. No CT evidence for acute intra-abdominal or pelvic pathology. Negative for  bowel obstruction 2. Mildly prominent right lower quadrant lymph nodes in the mesentery, query adenitis 3. Punctate nonobstructing stones in the right kidney Electronically Signed   By: Jasmine Pang M.D.   On: 09/18/2016 19:41    Procedures Procedures (including critical care time)  Medications Ordered in ED Medications  ondansetron (ZOFRAN-ODT) disintegrating tablet 4 mg (not administered)  morphine 2 MG/ML injection 2 mg (not administered)  sodium chloride 0.9 % bolus 1,000 mL (1,000 mLs Intravenous New Bag/Given 09/18/16 1831)  ondansetron (ZOFRAN) injection 4 mg (4 mg Intravenous Given 09/18/16 1832)  dicyclomine (BENTYL) injection 20 mg (20 mg Intramuscular Given 09/18/16 1832)  iopamidol (ISOVUE-300) 61 % injection 100 mL (100 mLs Intravenous Contrast Given 09/18/16 1920)  promethazine (PHENERGAN) injection 12.5 mg (12.5 mg Intravenous Given 09/18/16 1942)  morphine 4 MG/ML injection 4 mg (4 mg Intravenous Given 09/18/16 1942)     Initial Impression / Assessment and Plan / ED Course  I have reviewed the triage vital signs and the nursing notes.  Pertinent labs & imaging results that were available during my care of the patient were reviewed by me and considered in my medical decision making (see chart for details).     23 old male who presents with 2 and half days of nausea/vomiting/diarrhea and abdominal pain. Patient is afebrile, non-toxic appearing, appears slightly uncomfortable. Vital signs reviewed. Patient is slightly tachycardic and hypertensive, likely secondary to pain. Consider gastroenteritis versus viral syndrome.  Plan to check basic labs including CBC, CMP, UA. Analgesics provided in the department. Antiemetics provided in the department. IVF given for fluid resuscitation.   Labs reviewed. CMP shows hyperglycemia. CBC without evidence of leukocytosis. UA shows small amount of bilirubin and small amount of ketones, likely secondary to dehydration. Discussed results with  patient. He reports no improvement in nausea or pain after initial medications. We'll give additional medications.  Patient reviewed on the [place] substance database. He is a long-standing history of prescription pain medications. He'll most recently received oxycodone on 08/28/16. Prior to that he had received Norco on 09/01/16 and tramadol on 08/28/16. Plan to order additional CT abdomen and pelvis for evaluation given persistent pain and symptoms.  CT abdomen and pelvis reviewed. Negative for any acute infectious etiology. No evidence of appendicitis or diverticulitis. Plan to by mouth challenge patient in department  Patient was able to drink without any nausea or vomiting department. He reports mild improvement in pain. Repeat abdominal exam is improved. Still mildly tender but no peritoneal signs. Symptoms likely result of viral etiology. Discussed results with patient. Conservative therapy discussed. We'll plan to treat symptomatically. Provided patient with a list of clinic resources to use if he does not have a PCP. Instructed to call them today to arrange follow-up in the next 24-48 hours. Return precautions discussed.  Patient expresses understanding and agreement to plan.    Final Clinical Impressions(s) / ED Diagnoses   Final diagnoses:  Nausea vomiting and diarrhea  Lower abdominal pain    New Prescriptions New Prescriptions   DICYCLOMINE (BENTYL) 20 MG TABLET    Take 1 tablet (20 mg total) by mouth 2 (two) times daily.   ONDANSETRON (ZOFRAN) 4 MG TABLET    Take 1 tablet (4 mg total) by mouth every 6 (six) hours.     Maxwell Caul, PA-C 09/20/16 1610    Linwood Dibbles, MD 09/22/16 [phone]

## 2016-09-18 NOTE — ED Triage Notes (Signed)
N/V/D x 3 days. Ambulatory.

## 2016-09-29 ENCOUNTER — Emergency Department (HOSPITAL_BASED_OUTPATIENT_CLINIC_OR_DEPARTMENT_OTHER)
Admission: EM | Admit: 2016-09-29 | Discharge: 2016-09-29 | Disposition: A | Discharge status: Home / Self Care | Payer: Medicaid Other | Attending: Emergency Medicine | Admitting: Emergency Medicine

## 2016-09-29 ENCOUNTER — Encounter (HOSPITAL_BASED_OUTPATIENT_CLINIC_OR_DEPARTMENT_OTHER): Payer: Self-pay | Admitting: *Deleted

## 2016-09-29 DIAGNOSIS — R112 Nausea with vomiting, unspecified: Secondary | ICD-10-CM | POA: Diagnosis not present

## 2016-09-29 DIAGNOSIS — R109 Unspecified abdominal pain: Secondary | ICD-10-CM | POA: Diagnosis present

## 2016-09-29 DIAGNOSIS — F17210 Nicotine dependence, cigarettes, uncomplicated: Secondary | ICD-10-CM | POA: Diagnosis not present

## 2016-09-29 DIAGNOSIS — Z79899 Other long term (current) drug therapy: Secondary | ICD-10-CM | POA: Insufficient documentation

## 2016-09-29 DIAGNOSIS — R1084 Generalized abdominal pain: Secondary | ICD-10-CM | POA: Insufficient documentation

## 2016-09-29 LAB — URINALYSIS, ROUTINE W REFLEX MICROSCOPIC
Bilirubin Urine: NEGATIVE
GLUCOSE, UA: NEGATIVE mg/dL
HGB URINE DIPSTICK: NEGATIVE
KETONES UR: NEGATIVE mg/dL
LEUKOCYTES UA: NEGATIVE
Nitrite: NEGATIVE
PROTEIN: NEGATIVE mg/dL
Specific Gravity, Urine: 1.008 (ref 1.005–1.030)
pH: 6 (ref 5.0–8.0)

## 2016-09-29 LAB — CBC WITH DIFFERENTIAL/PLATELET
BASOS ABS: 0.1 10*3/uL (ref 0.0–0.1)
Basophils Relative: 1 %
EOS ABS: 0.3 10*3/uL (ref 0.0–0.7)
EOS PCT: 4 %
HCT: 48.8 % (ref 39.0–52.0)
Hemoglobin: 17.2 g/dL — ABNORMAL HIGH (ref 13.0–17.0)
Lymphocytes Relative: 27 %
Lymphs Abs: 1.9 10*3/uL (ref 0.7–4.0)
MCH: 29.3 pg (ref 26.0–34.0)
MCHC: 35.2 g/dL (ref 30.0–36.0)
MCV: 83.1 fL (ref 78.0–100.0)
MONO ABS: 0.6 10*3/uL (ref 0.1–1.0)
Monocytes Relative: 9 %
Neutro Abs: 4.3 10*3/uL (ref 1.7–7.7)
Neutrophils Relative %: 59 %
PLATELETS: 345 10*3/uL (ref 150–400)
RBC: 5.87 MIL/uL — AB (ref 4.22–5.81)
RDW: 12.9 % (ref 11.5–15.5)
WBC: 7.1 10*3/uL (ref 4.0–10.5)

## 2016-09-29 LAB — COMPREHENSIVE METABOLIC PANEL
ALK PHOS: 106 U/L (ref 38–126)
ALT: 44 U/L (ref 17–63)
AST: 32 U/L (ref 15–41)
Albumin: 4.4 g/dL (ref 3.5–5.0)
Anion gap: 11 (ref 5–15)
BILIRUBIN TOTAL: 0.8 mg/dL (ref 0.3–1.2)
BUN: 12 mg/dL (ref 6–20)
CALCIUM: 9.2 mg/dL (ref 8.9–10.3)
CO2: 26 mmol/L (ref 22–32)
CREATININE: 0.9 mg/dL (ref 0.61–1.24)
Chloride: 101 mmol/L (ref 101–111)
Glucose, Bld: 94 mg/dL (ref 65–99)
Potassium: 3.9 mmol/L (ref 3.5–5.1)
Sodium: 138 mmol/L (ref 135–145)
TOTAL PROTEIN: 7.9 g/dL (ref 6.5–8.1)

## 2016-09-29 LAB — RAPID URINE DRUG SCREEN, HOSP PERFORMED
AMPHETAMINES: NOT DETECTED
BENZODIAZEPINES: NOT DETECTED
Barbiturates: NOT DETECTED
Cocaine: NOT DETECTED
OPIATES: NOT DETECTED
Tetrahydrocannabinol: NOT DETECTED

## 2016-09-29 LAB — LIPASE, BLOOD: LIPASE: 22 U/L (ref 11–51)

## 2016-09-29 MED ORDER — PROMETHAZINE HCL 25 MG/ML IJ SOLN
12.5000 mg | Freq: Once | INTRAMUSCULAR | Status: AC
  Administered 2016-09-29: 12.5 mg via INTRAVENOUS
  Filled 2016-09-29: qty 1

## 2016-09-29 MED ORDER — SODIUM CHLORIDE 0.9 % IV BOLUS (SEPSIS)
1000.0000 mL | Freq: Once | INTRAVENOUS | Status: AC
  Administered 2016-09-29: 1000 mL via INTRAVENOUS

## 2016-09-29 MED ORDER — GI COCKTAIL ~~LOC~~
30.0000 mL | Freq: Once | ORAL | Status: AC
  Administered 2016-09-29: 30 mL via ORAL
  Filled 2016-09-29: qty 30

## 2016-09-29 NOTE — ED Provider Notes (Signed)
[facility]   CSN: [id number] Arrival date & time: 09/29/16  4098     History   Chief Complaint Chief Complaint  Patient presents with  . Abdominal Pain    HPI Ricardo Sweeney is a 26 y.o. male.  Patient is a 26 year old male with a history of peptic ulcer disease, C. difficile, irritable bowel syndrome and prior kidney stones who presents with abdominal pain associated with nausea and vomiting. He states he's had trouble for about 2 weeks now. He was seen on July 27 for similar symptoms. He has CT scan of the abdomen and pelvis at that time which was negative for acute abnormalities. He states that he's also had some ongoing issues with similar pain associated nausea and vomiting over last couple of years. He states he has an appointment to follow-up with gastroenterology in [place] on August 9. He states he woke up this morning and started having some nausea and vomiting with some generalized abdominal pain. He denies any urinary symptoms. He had a normal bowel movement this morning. No fevers. No blood in his stool. No bloody emesis. He has not taken anything at home for the pain.      Past Medical History:  Diagnosis Date  . Clostridium difficile infection   . Drug-seeking behavior   . IBD (inflammatory bowel disease)   . IBS (irritable bowel syndrome)   . Kidney stone   . Kidney stone   . Multiple gastric ulcers   . Narcotic abuse     Patient Active Problem List   Diagnosis Date Noted  . Left knee injury, initial encounter 12/25/2015  . Nephrolithiasis 12/26/2013    Past Surgical History:  Procedure Laterality Date  . COLONOSCOPY    . kidney stone stent    . LITHOTRIPSY         Home Medications    Prior to Admission medications   Medication Sig Start Date End Date Taking? Authorizing Provider  Multiple Vitamin (MULTIVITAMIN) tablet Take 1 tablet by mouth daily.   Yes [provider]  promethazine (PHENERGAN) 25 MG tablet  Take 25 mg by mouth every 6 (six) hours as needed for nausea or vomiting.   Yes [provider]  tamsulosin (FLOMAX) 0.4 MG CAPS capsule Take 1 capsule (0.4 mg total) by mouth daily. 09/01/16  Yes Leaphart, Lynann Beaver, PA-C  cephALEXin (KEFLEX) 500 MG capsule Take 1 capsule (500 mg total) by mouth 2 (two) times daily. 09/01/16   Rise Mu, PA-C  dicyclomine (BENTYL) 20 MG tablet Take 1 tablet (20 mg total) by mouth 2 (two) times daily. 09/18/16   Maxwell Caul, PA-C  ondansetron (ZOFRAN) 4 MG tablet Take 1 tablet (4 mg total) by mouth every 6 (six) hours. 09/18/16   Maxwell Caul, PA-C  ondansetron (ZOFRAN-ODT) 8 MG disintegrating tablet Take 1 tablet (8 mg total) by mouth every 8 (eight) hours as needed for nausea. 09/01/16   Rise Mu, PA-C  oxyCODONE-acetaminophen (PERCOCET/ROXICET) 5-325 MG tablet Take 2 tablets by mouth every 4 (four) hours as needed for severe pain. 09/01/16   Rise Mu, PA-C    Family History No family history on file.  Social History Social History  Substance Use Topics  . Smoking status: Current Every Day Smoker    Types: Cigarettes  . Smokeless tobacco: Current User    Types: Chew  . Alcohol use Yes     Comment: occ     Allergies   Tape and  Ultram [tramadol]   Review of Systems Review of Systems  Constitutional: Negative for chills, diaphoresis, fatigue and fever.  HENT: Negative for congestion, rhinorrhea and sneezing.   Eyes: Negative.   Respiratory: Negative for cough, chest tightness and shortness of breath.   Cardiovascular: Negative for chest pain and leg swelling.  Gastrointestinal: Positive for abdominal pain, nausea and vomiting. Negative for blood in stool and diarrhea.  Genitourinary: Negative for difficulty urinating, flank pain, frequency and hematuria.  Musculoskeletal: Negative for arthralgias and back pain.  Skin: Negative for rash.  Neurological: Negative for dizziness, speech difficulty,  weakness, numbness and headaches.     Physical Exam Updated Vital Signs BP 129/84 (BP Location: Right Arm)   Pulse 84   Temp 98 F (36.7 C) (Oral)   Resp 16   Ht 5\' 9"  (1.753 m)   Wt 104.3 kg (230 lb)   SpO2 100%   BMI 33.97 kg/m   Physical Exam  Constitutional: He is oriented to person, place, and time. He appears well-developed and well-nourished.  HENT:  Head: Normocephalic and atraumatic.  Eyes: Pupils are equal, round, and reactive to light.  Neck: Normal range of motion. Neck supple.  Cardiovascular: Normal rate, regular rhythm and normal heart sounds.   Pulmonary/Chest: Effort normal and breath sounds normal. No respiratory distress. He has no wheezes. He has no rales. He exhibits no tenderness.  Abdominal: Soft. Bowel sounds are normal. There is no tenderness. There is no rebound and no guarding.  Musculoskeletal: Normal range of motion. He exhibits no edema.  Lymphadenopathy:    He has no cervical adenopathy.  Neurological: He is alert and oriented to person, place, and time.  Skin: Skin is warm and dry. No rash noted.  Psychiatric: He has a normal mood and affect.     ED Treatments / Results  Labs (all labs ordered are listed, but only abnormal results are displayed) Labs Reviewed  CBC WITH DIFFERENTIAL/PLATELET - Abnormal; Notable for the following:       Result Value   RBC 5.87 (*)    Hemoglobin 17.2 (*)    All other components within normal limits  COMPREHENSIVE METABOLIC PANEL  LIPASE, BLOOD  URINALYSIS, ROUTINE W REFLEX MICROSCOPIC  RAPID URINE DRUG SCREEN, HOSP PERFORMED    EKG  EKG Interpretation None       Radiology No results found.  Procedures Procedures (including critical care time)  Medications Ordered in ED Medications  sodium chloride 0.9 % bolus 1,000 mL (0 mLs Intravenous Stopped 09/29/16 0951)  promethazine (PHENERGAN) injection 12.5 mg (12.5 mg Intravenous Given 09/29/16 0911)     Initial Impression / Assessment and Plan /  ED Course  I have reviewed the triage vital signs and the nursing notes.  Pertinent labs & imaging results that were available during my care of the patient were reviewed by me and considered in my medical decision making (see chart for details).     Patient presents with nausea vomiting and crampy abdominal pain. He's had these symptoms are currently. He had a recent CT scan of his abdomen and pelvis which was unremarkable. His labs today are non-concerning. He was given IV fluids and Phenergan for symptomatic treatment. He's feeling much better and is able to tolerate oral fluids without vomiting. He has no abdominal tenderness on exam. He was discharged home in good condition. He has an appointment to follow-up with a new gastroenterologist in 2 days. Return precautions were given. He was advised to use clear liquids only  for the next 24 hours.  Final Clinical Impressions(s) / ED Diagnoses   Final diagnoses:  Nausea and vomiting in adult    New Prescriptions New Prescriptions   No medications on file     Rolan BuccoBelfi, Brynleigh Sequeira, MD 09/29/16 1035

## 2016-09-29 NOTE — ED Notes (Signed)
Pt able to tolerate sips of water with no n/v (drank in total approx ).

## 2016-09-29 NOTE — ED Notes (Signed)
ED Provider at bedside. 

## 2016-09-29 NOTE — ED Notes (Signed)
RT unsuccessful iv attempts times two right antecubital. Blood flash unable to advance catheter

## 2016-09-29 NOTE — ED Triage Notes (Signed)
Pt reports chronic abd pain that worsened this am (reports upcoming GI appt this Thursday and states was evaluated at [facility] this past Friday for same). Reports n/v. Denies fever, diarrhea.

## 2016-10-13 ENCOUNTER — Encounter (HOSPITAL_BASED_OUTPATIENT_CLINIC_OR_DEPARTMENT_OTHER): Payer: Self-pay

## 2016-10-13 ENCOUNTER — Emergency Department (HOSPITAL_BASED_OUTPATIENT_CLINIC_OR_DEPARTMENT_OTHER)
Admission: EM | Admit: 2016-10-13 | Discharge: 2016-10-13 | Disposition: A | Discharge status: Home / Self Care | Payer: Medicaid Other | Attending: Emergency Medicine | Admitting: Emergency Medicine

## 2016-10-13 ENCOUNTER — Emergency Department (HOSPITAL_BASED_OUTPATIENT_CLINIC_OR_DEPARTMENT_OTHER): Payer: Medicaid Other

## 2016-10-13 DIAGNOSIS — F17210 Nicotine dependence, cigarettes, uncomplicated: Secondary | ICD-10-CM | POA: Insufficient documentation

## 2016-10-13 DIAGNOSIS — Z79899 Other long term (current) drug therapy: Secondary | ICD-10-CM | POA: Insufficient documentation

## 2016-10-13 DIAGNOSIS — R109 Unspecified abdominal pain: Secondary | ICD-10-CM

## 2016-10-13 DIAGNOSIS — R1011 Right upper quadrant pain: Secondary | ICD-10-CM | POA: Diagnosis not present

## 2016-10-13 LAB — COMPREHENSIVE METABOLIC PANEL
ALK PHOS: 105 U/L (ref 38–126)
ALT: 47 U/L (ref 17–63)
AST: 28 U/L (ref 15–41)
Albumin: 4.1 g/dL (ref 3.5–5.0)
Anion gap: 10 (ref 5–15)
BUN: 10 mg/dL (ref 6–20)
CALCIUM: 9.3 mg/dL (ref 8.9–10.3)
CO2: 25 mmol/L (ref 22–32)
CREATININE: 0.81 mg/dL (ref 0.61–1.24)
Chloride: 102 mmol/L (ref 101–111)
GFR calc non Af Amer: >60 mL/min (ref >60)
Glucose, Bld: 92 mg/dL (ref 65–99)
Potassium: 4.3 mmol/L (ref 3.5–5.1)
SODIUM: 137 mmol/L (ref 135–145)
Total Bilirubin: 0.3 mg/dL (ref 0.3–1.2)
Total Protein: 7.9 g/dL (ref 6.5–8.1)

## 2016-10-13 LAB — URINALYSIS, MICROSCOPIC (REFLEX)

## 2016-10-13 LAB — URINALYSIS, ROUTINE W REFLEX MICROSCOPIC
Bilirubin Urine: NEGATIVE
GLUCOSE, UA: NEGATIVE mg/dL
Ketones, ur: NEGATIVE mg/dL
Nitrite: NEGATIVE
Protein, ur: NEGATIVE mg/dL
SPECIFIC GRAVITY, URINE: 1.011 (ref 1.005–1.030)
pH: 7.5 (ref 5.0–8.0)

## 2016-10-13 MED ORDER — TAMSULOSIN HCL 0.4 MG PO CAPS: 0.4000 mg | ORAL_CAPSULE | Freq: Every day | ORAL | 0 refills | Status: AC

## 2016-10-13 MED ORDER — KETOROLAC TROMETHAMINE 30 MG/ML IJ SOLN
30.0000 mg | Freq: Once | INTRAMUSCULAR | Status: AC
  Administered 2016-10-13: 30 mg via INTRAVENOUS
  Filled 2016-10-13: qty 1

## 2016-10-13 MED ORDER — SODIUM CHLORIDE 0.9 % IV BOLUS (SEPSIS)
1000.0000 mL | Freq: Once | INTRAVENOUS | Status: AC
  Administered 2016-10-13: 1000 mL via INTRAVENOUS

## 2016-10-13 MED ORDER — ONDANSETRON HCL 4 MG/2ML IJ SOLN
4.0000 mg | Freq: Once | INTRAMUSCULAR | Status: AC
  Administered 2016-10-13: 4 mg via INTRAVENOUS
  Filled 2016-10-13: qty 2

## 2016-10-13 NOTE — ED Provider Notes (Signed)
[facility] Provider Note   CSN: [id number] Arrival date & time: 10/13/16  1304     History   Chief Complaint Chief Complaint  Patient presents with  . Flank Pain    HPI Ricardo Sweeney is a 26 y.o. male with history of IBS, IBD, nephrolithiasis, narcotic abuse, and drug-seeking behavior who presents today with chief complaint acute onset, constant right flank pain which began earlier this morning. Patient states the pain woke him at around 3 AM. Pain is sharp, worse with movement and urination. At times radiates to the right groin. Also endorses hematuria, nausea and chills. Denies vomiting, Phenergan has been helpful for his symptoms. Was seen and evaluated for abdominal pain recently, states this feels different and is more consistent with his usual kidney stone pain.  The history is provided by the patient.    Past Medical History:  Diagnosis Date  . Clostridium difficile infection   . Drug-seeking behavior   . IBD (inflammatory bowel disease)   . IBS (irritable bowel syndrome)   . Kidney stone   . Kidney stone   . Multiple gastric ulcers   . Narcotic abuse     Patient Active Problem List   Diagnosis Date Noted  . Left knee injury, initial encounter 12/25/2015  . Nephrolithiasis 12/26/2013    Past Surgical History:  Procedure Laterality Date  . COLONOSCOPY    . kidney stone stent    . LITHOTRIPSY         Home Medications    Prior to Admission medications   Medication Sig Start Date End Date Taking? Authorizing Provider  cephALEXin (KEFLEX) 500 MG capsule Take 1 capsule (500 mg total) by mouth 2 (two) times daily. 09/01/16   Rise Mu, PA-C  dicyclomine (BENTYL) 20 MG tablet Take 1 tablet (20 mg total) by mouth 2 (two) times daily. 09/18/16   Maxwell Caul, PA-C  Multiple Vitamin (MULTIVITAMIN) tablet Take 1 tablet by mouth daily.    [provider]  ondansetron (ZOFRAN) 4 MG tablet Take 1 tablet (4 mg total) by mouth every 6  (six) hours. 09/18/16   Maxwell Caul, PA-C  ondansetron (ZOFRAN-ODT) 8 MG disintegrating tablet Take 1 tablet (8 mg total) by mouth every 8 (eight) hours as needed for nausea. 09/01/16   Rise Mu, PA-C  oxyCODONE-acetaminophen (PERCOCET/ROXICET) 5-325 MG tablet Take 2 tablets by mouth every 4 (four) hours as needed for severe pain. 09/01/16   Rise Mu, PA-C  promethazine (PHENERGAN) 25 MG tablet Take 25 mg by mouth every 6 (six) hours as needed for nausea or vomiting.    [provider]  tamsulosin (FLOMAX) 0.4 MG CAPS capsule Take 1 capsule (0.4 mg total) by mouth daily after supper. 10/13/16 10/18/16  Jeanie Sewer, PA-C    Family History No family history on file.  Social History Social History  Substance Use Topics  . Smoking status: Current Every Day Smoker    Types: Cigarettes  . Smokeless tobacco: Current User    Types: Chew  . Alcohol use Yes     Comment: occ     Allergies   Tape and Ultram [tramadol]   Review of Systems Review of Systems  Constitutional: Positive for chills. Negative for fever.  Gastrointestinal: Positive for nausea. Negative for abdominal pain, diarrhea and vomiting.  Genitourinary: Positive for dysuria, flank pain and hematuria. Negative for decreased urine volume, testicular pain and urgency.  All other systems reviewed and are negative.  Physical Exam Updated Vital Signs BP 131/90 (BP Location: Right Arm)   Pulse 78   Temp 98.4 F (36.9 C) (Oral)   Resp 16   Ht 5\' 9"  (1.753 m)   Wt 106.6 kg (235 lb)   SpO2 100%   BMI 34.70 kg/m   Physical Exam  Constitutional: He appears well-developed and well-nourished. No distress.  Resting comfortably on his left side in bed  HENT:  Head: Normocephalic and atraumatic.  Eyes: Conjunctivae are normal. Right eye exhibits no discharge. Left eye exhibits no discharge.  Neck: No JVD present. No tracheal deviation present.  Cardiovascular: Normal rate and regular  rhythm.   Pulmonary/Chest: Effort normal and breath sounds normal.  Abdominal: Soft. Bowel sounds are normal. He exhibits no distension. There is tenderness.  Right CVA tenderness and right flank tenderness present diffusely  Musculoskeletal: He exhibits no edema.  No midline spine TTP, right paraspinal muscle and flank tenderness. No deformity, crepitus, or step-off noted  Neurological: He is alert.  Skin: Skin is warm and dry. No erythema.  Psychiatric: He has a normal mood and affect. His behavior is normal.  Nursing note and vitals reviewed.    ED Treatments / Results  Labs (all labs ordered are listed, but only abnormal results are displayed) Labs Reviewed  URINALYSIS, ROUTINE W REFLEX MICROSCOPIC - Abnormal; Notable for the following:       Result Value   APPearance CLOUDY (*)    Hgb urine dipstick LARGE (*)    Leukocytes, UA TRACE (*)    All other components within normal limits  URINALYSIS, MICROSCOPIC (REFLEX) - Abnormal; Notable for the following:    Bacteria, UA RARE (*)    Squamous Epithelial / LPF 0-5 (*)    All other components within normal limits  URINE CULTURE  COMPREHENSIVE METABOLIC PANEL    EKG  EKG Interpretation None       Radiology US Renal  Result Date: 10/13/2016 CLINICAL DATA:  Right flank pain. History of lithotripsy and renal stent placement. EXAM: RENAL / URINARY TRACT ULTRASOUND COMPLETE COMPARISON:  Renal ultrasound of September 01, 2016 and abdominal CT scan dated September 18, 2016 FINDINGS: Right Kidney: Length: 9.5 cm. The renal cortical echotexture remains lower than that of the liver. There is no hydronephrosis. There is a lower pole stone measuring up to 9 mm in diameter. Left Kidney: Length: 10.8 cm. The renal cortical echotexture is similar to that on the right. There is a mid pole shadowing stone measuring 8 mm in diameter. There is no hydronephrosis. Bladder: Bilateral ureteral jets are observed. The partially distended urinary bladder is  unremarkable. IMPRESSION: Bilateral nonobstructing kidney stones.  No hydronephrosis. Electronically Signed   By: David  [place] M.D.   On: 10/13/2016 14:56    Procedures Procedures (including critical care time)  Medications Ordered in ED Medications  ketorolac (TORADOL) 30 MG/ML injection 30 mg (30 mg Intravenous Given 10/13/16 1357)  sodium chloride 0.9 % bolus 1,000 mL (0 mLs Intravenous Stopped 10/13/16 1451)  ondansetron (ZOFRAN) injection 4 mg (4 mg Intravenous Given 10/13/16 1357)     Initial Impression / Assessment and Plan / ED Course  I have reviewed the triage vital signs and the nursing notes.  Pertinent labs & imaging results that were available during my care of the patient were reviewed by me and considered in my medical decision making (see chart for details).     Patient with complaint of right flank pain and hematuria. Has a history of nephrolithiasis.  Afebrile, vital signs are stable. CMP unremarkable. UA shows RBCs and rare bacteria. He renal ultrasound shows bilateral nonobstructing kidney stones and no hydronephrosis. Pain managed while in the ED. Doubt appendicitis, colitis, or other surgical abdominal pathology as source of his symptoms. Patient is overall well-appearing and in no apparent distress on reevaluation. He is stable for discharge home with Flomax which has been helpful to him in the past with his kidney stones as well as follow-up with urology. Discussed indications for return to the ED. Pt verbalized understanding of and agreement with plan and is safe for discharge home at this time.   Final Clinical Impressions(s) / ED Diagnoses   Final diagnoses:  Right flank pain    New Prescriptions Discharge Medication List as of 10/13/2016  3:23 PM       Jeanie Sewer, PA-C 10/13/16 1654    Shaune Pollack, MD 10/14/16 [phone]

## 2016-10-13 NOTE — ED Notes (Signed)
ED Provider at bedside. 

## 2016-10-13 NOTE — Discharge Instructions (Signed)
Take 400 mg of ibuprofen every 6 hours. Alternate with [phone] mg of Tylenol every 3 hours as needed for pain. Do not exceed 4000 mg of Tylenol daily. Apply ice or heat to the affected area for comfort.take Flomax once daily in the evenings, be aware this medication may make you lightheaded.follow-up with your primary care physician or urologist for reevaluation. Return to the ED if any concerning signs or symptoms develop.

## 2016-10-13 NOTE — ED Triage Notes (Signed)
C/o right flank pain, blood in urine-states "another kidney stone"-NAD-steady gait

## 2016-10-14 LAB — URINE CULTURE

## 2017-07-11 ENCOUNTER — Other Ambulatory Visit: Payer: Self-pay

## 2017-07-11 ENCOUNTER — Encounter (HOSPITAL_BASED_OUTPATIENT_CLINIC_OR_DEPARTMENT_OTHER): Payer: Self-pay | Admitting: *Deleted

## 2017-07-11 ENCOUNTER — Emergency Department (HOSPITAL_BASED_OUTPATIENT_CLINIC_OR_DEPARTMENT_OTHER)
Admission: EM | Admit: 2017-07-11 | Discharge: 2017-07-12 | Disposition: A | Discharge status: Home / Self Care | Payer: Medicaid Other | Attending: Emergency Medicine | Admitting: Emergency Medicine

## 2017-07-11 DIAGNOSIS — N200 Calculus of kidney: Secondary | ICD-10-CM | POA: Insufficient documentation

## 2017-07-11 DIAGNOSIS — Z79899 Other long term (current) drug therapy: Secondary | ICD-10-CM | POA: Insufficient documentation

## 2017-07-11 DIAGNOSIS — R109 Unspecified abdominal pain: Secondary | ICD-10-CM | POA: Diagnosis present

## 2017-07-11 HISTORY — DX: Calculus of gallbladder without cholecystitis without obstruction: K80.20

## 2017-07-11 NOTE — ED Triage Notes (Addendum)
C/o right flank and testicle pain. C/o n/v times 10 and feels like he needs to urinate but not able. States pain started this morning and history of kidney stones. Has not taken any medications prior to arrival. Pt unable to sit still in triage. States he is not able to urinate at present.

## 2017-07-12 ENCOUNTER — Emergency Department (HOSPITAL_BASED_OUTPATIENT_CLINIC_OR_DEPARTMENT_OTHER): Payer: Medicaid Other

## 2017-07-12 LAB — URINALYSIS, ROUTINE W REFLEX MICROSCOPIC
Glucose, UA: NEGATIVE mg/dL
Ketones, ur: NEGATIVE mg/dL
Leukocytes, UA: NEGATIVE
NITRITE: NEGATIVE
Protein, ur: 100 mg/dL — AB
pH: 5.5 (ref 5.0–8.0)

## 2017-07-12 LAB — CBC WITH DIFFERENTIAL/PLATELET
BASOS PCT: 0 %
Basophils Absolute: 0.1 10*3/uL (ref 0.0–0.1)
EOS ABS: 0.1 10*3/uL (ref 0.0–0.7)
Eosinophils Relative: 1 %
HEMATOCRIT: 45.2 % (ref 39.0–52.0)
HEMOGLOBIN: 16.2 g/dL (ref 13.0–17.0)
LYMPHS ABS: 1.4 10*3/uL (ref 0.7–4.0)
Lymphocytes Relative: 9 %
MCH: 29.8 pg (ref 26.0–34.0)
MCHC: 35.8 g/dL (ref 30.0–36.0)
MCV: 83.2 fL (ref 78.0–100.0)
MONO ABS: 0.6 10*3/uL (ref 0.1–1.0)
Monocytes Relative: 4 %
NEUTROS ABS: 12.6 10*3/uL — AB (ref 1.7–7.7)
Neutrophils Relative %: 86 %
Platelets: 359 10*3/uL (ref 150–400)
RBC: 5.43 MIL/uL (ref 4.22–5.81)
RDW: 12.5 % (ref 11.5–15.5)
WBC: 14.7 10*3/uL — ABNORMAL HIGH (ref 4.0–10.5)

## 2017-07-12 LAB — URINALYSIS, MICROSCOPIC (REFLEX)

## 2017-07-12 LAB — BASIC METABOLIC PANEL
Anion gap: 10 (ref 5–15)
BUN: 11 mg/dL (ref 6–20)
CALCIUM: 9 mg/dL (ref 8.9–10.3)
CHLORIDE: 102 mmol/L (ref 101–111)
CO2: 25 mmol/L (ref 22–32)
CREATININE: 0.89 mg/dL (ref 0.61–1.24)
GFR calc non Af Amer: >60 mL/min (ref >60)
Glucose, Bld: 118 mg/dL — ABNORMAL HIGH (ref 65–99)
Potassium: 3.9 mmol/L (ref 3.5–5.1)
Sodium: 137 mmol/L (ref 135–145)

## 2017-07-12 MED ORDER — MORPHINE SULFATE (PF) 4 MG/ML IV SOLN
4.0000 mg | Freq: Once | INTRAVENOUS | Status: AC
  Administered 2017-07-12: 4 mg via INTRAVENOUS
  Filled 2017-07-12: qty 1

## 2017-07-12 MED ORDER — OXYCODONE-ACETAMINOPHEN 5-325 MG PO TABS
2.0000 | ORAL_TABLET | Freq: Once | ORAL | Status: AC
  Administered 2017-07-12: 2 via ORAL
  Filled 2017-07-12: qty 2

## 2017-07-12 MED ORDER — TAMSULOSIN HCL 0.4 MG PO CAPS: 0.4000 mg | ORAL_CAPSULE | Freq: Every day | ORAL | 0 refills | Status: AC

## 2017-07-12 MED ORDER — ONDANSETRON 4 MG PO TBDP: 4.0000 mg | ORAL_TABLET | Freq: Three times a day (TID) | ORAL | 0 refills | Status: DC | PRN

## 2017-07-12 MED ORDER — ONDANSETRON HCL 4 MG/2ML IJ SOLN
4.0000 mg | Freq: Once | INTRAMUSCULAR | Status: AC
  Administered 2017-07-12: 4 mg via INTRAVENOUS
  Filled 2017-07-12: qty 2

## 2017-07-12 MED ORDER — SODIUM CHLORIDE 0.9 % IV BOLUS
1000.0000 mL | Freq: Once | INTRAVENOUS | Status: AC
  Administered 2017-07-12: 1000 mL via INTRAVENOUS

## 2017-07-12 MED ORDER — OXYCODONE-ACETAMINOPHEN 5-325 MG PO TABS: 1.0000 | ORAL_TABLET | Freq: Four times a day (QID) | ORAL | 0 refills | Status: DC | PRN

## 2017-07-12 MED ORDER — KETOROLAC TROMETHAMINE 30 MG/ML IJ SOLN
30.0000 mg | Freq: Once | INTRAMUSCULAR | Status: AC
  Administered 2017-07-12: 30 mg via INTRAMUSCULAR
  Filled 2017-07-12: qty 1

## 2017-07-12 NOTE — ED Notes (Signed)
Pt and FM given d/c instructions as per chart. Rx x 3 with precautions. Verbalizes understanding. No questions.

## 2017-07-12 NOTE — ED Provider Notes (Signed)
[facility] EMERGENCY DEPARTMENT Provider Note   CSN: [id number] Arrival date & time: 07/11/17  2326     History   Chief Complaint Chief Complaint  Patient presents with  . Flank Pain    HPI Ricardo Sweeney is a 27 y.o. male.  HPI  This is a 27 year old male with a history of kidney stones who presents with right flank pain.  Patient reports onset of right flank pain yesterday.  He states pain is sharp and radiates into his right testicle.  Denies any testicular swelling.  Reports history of multiple kidney stones in the past with similar symptoms.  He reports multiple episodes of nonbilious, nonbloody emesis.  Currently he rates his pain at 10 out of 10.  He has not taken anything for the pain.  Nothing seems to make it better or worse.  He denies hematuria, dysuria, fevers.  Past Medical History:  Diagnosis Date  . Clostridium difficile infection   . Drug-seeking behavior   . Gallstones   . IBD (inflammatory bowel disease)   . IBS (irritable bowel syndrome)   . Kidney stone   . Kidney stone   . Multiple gastric ulcers   . Narcotic abuse [facility])     Patient Active Problem List   Diagnosis Date Noted  . Left knee injury, initial encounter 12/25/2015  . Nephrolithiasis 12/26/2013    Past Surgical History:  Procedure Laterality Date  . COLONOSCOPY    . kidney stone stent    . LITHOTRIPSY          Home Medications    Prior to Admission medications   Medication Sig Start Date End Date Taking? Authorizing Provider  oxyCODONE (OXY IR/ROXICODONE) 5 MG immediate release tablet Take 5 mg by mouth every 4 (four) hours as needed for severe pain.   Yes [provider]  cephALEXin (KEFLEX) 500 MG capsule Take 1 capsule (500 mg total) by mouth 2 (two) times daily. 09/01/16   Rise Mu, PA-C  dicyclomine (BENTYL) 20 MG tablet Take 1 tablet (20 mg total) by mouth 2 (two) times daily. 09/18/16   Maxwell Caul, PA-C  Multiple Vitamin (MULTIVITAMIN)  tablet Take 1 tablet by mouth daily.    [provider]  ondansetron (ZOFRAN ODT) 4 MG disintegrating tablet Take 1 tablet (4 mg total) by mouth every 8 (eight) hours as needed for nausea or vomiting. 07/12/17   Horton, Mayer Masker, MD  oxyCODONE-acetaminophen (PERCOCET/ROXICET) 5-325 MG tablet Take 1 tablet by mouth every 6 (six) hours as needed for severe pain. 07/12/17   Horton, Mayer Masker, MD  promethazine (PHENERGAN) 25 MG tablet Take 25 mg by mouth every 6 (six) hours as needed for nausea or vomiting.    [provider]  tamsulosin (FLOMAX) 0.4 MG CAPS capsule Take 1 capsule (0.4 mg total) by mouth daily. 07/12/17   Horton, Mayer Masker, MD    Family History No family history on file.  Social History Social History   Tobacco Use  . Smoking status: Current Every Day Smoker    Types: Cigarettes  . Smokeless tobacco: Current User    Types: Chew  Substance Use Topics  . Alcohol use: Yes    Comment: occ  . Drug use: No     Allergies   Tape and Ultram [tramadol]   Review of Systems Review of Systems  Constitutional: Negative for fever.  Respiratory: Negative for shortness of breath.   Cardiovascular: Negative for chest pain.  Gastrointestinal: Positive for abdominal pain,  nausea and vomiting. Negative for diarrhea.  Genitourinary: Positive for flank pain. Negative for dysuria and hematuria.  All other systems reviewed and are negative.    Physical Exam Updated Vital Signs BP 105/68 (BP Location: Left Arm)   Pulse 78   Temp 98.3 F (36.8 C) (Oral)   Resp 18   Ht  (1.753 m)   Wt 108.7 kg (239 lb 10.2 oz)   SpO2 97%   BMI 35.39 kg/m   Physical Exam  Constitutional: He is oriented to person, place, and time. He appears well-developed and well-nourished.  Overweight, no acute distress  HENT:  Head: Normocephalic and atraumatic.  Eyes: Pupils are equal, round, and reactive to light.  Cardiovascular: Normal rate, regular rhythm and normal heart  sounds.  No murmur heard. Pulmonary/Chest: Effort normal and breath sounds normal. No respiratory distress. He has no wheezes.  Abdominal: Soft. Bowel sounds are normal. There is no tenderness. There is no rebound.  No CVA tenderness  Musculoskeletal: He exhibits no edema.  Neurological: He is alert and oriented to person, place, and time.  Skin: Skin is warm and dry.  Psychiatric: He has a normal mood and affect.  Nursing note and vitals reviewed.    ED Treatments / Results  Labs (all labs ordered are listed, but only abnormal results are displayed) Labs Reviewed  CBC WITH DIFFERENTIAL/PLATELET - Abnormal; Notable for the following components:      Result Value   WBC 14.7 (*)    Neutro Abs 12.6 (*)    All other components within normal limits  BASIC METABOLIC PANEL - Abnormal; Notable for the following components:   Glucose, Bld 118 (*)    All other components within normal limits  URINALYSIS, ROUTINE W REFLEX MICROSCOPIC - Abnormal; Notable for the following components:   APPearance CLOUDY (*)    Specific Gravity, Urine >1.030 (*)    Hgb urine dipstick LARGE (*)    Bilirubin Urine SMALL (*)    Protein, ur 100 (*)    All other components within normal limits  URINALYSIS, MICROSCOPIC (REFLEX) - Abnormal; Notable for the following components:   Bacteria, UA FEW (*)    All other components within normal limits    EKG None  Radiology Dg Abdomen 1 View  Result Date: 07/12/2017 CLINICAL DATA:  Right flank pain for 12 hours. History of renal stones. EXAM: ABDOMEN - 1 VIEW COMPARISON:  Radiograph 07/06/2017. Right upper quadrant ultrasound 07/07/2017 CT 11/04/2016 FINDINGS: 4 mm stone to the right of L3-L4 in the region of the right proximal ureter. No additional radiopaque calculi. Gallstones on recent abdominal ultrasound not visualized radiographically. Normal bowel gas pattern without obstruction or evidence of free air. IMPRESSION: 4 mm calcification to the right of L2-L3  suspicious for proximal right ureteral calculus. Electronically Signed   By: Rubye Oaks M.D.   On: 07/12/2017 00:42    Procedures Procedures (including critical care time)  Medications Ordered in ED Medications  ketorolac (TORADOL) 30 MG/ML injection 30 mg (30 mg Intramuscular Given 07/12/17 0054)  ondansetron (ZOFRAN) injection 4 mg (4 mg Intravenous Given 07/12/17 0054)  morphine 4 MG/ML injection 4 mg (4 mg Intravenous Given 07/12/17 0056)  sodium chloride 0.9 % bolus 1,000 mL (0 mLs Intravenous Stopped 07/12/17 0137)  oxyCODONE-acetaminophen (PERCOCET/ROXICET) 5-325 MG per tablet 2 tablet (2 tablets Oral Given 07/12/17 0141)     Initial Impression / Assessment and Plan / ED Course  I have reviewed the triage vital signs and the nursing  notes.  Pertinent labs & imaging results that were available during my care of the patient were reviewed by me and considered in my medical decision making (see chart for details).     She presents with right flank pain.  Pain is consistent with prior kidney stones.  He is overall nontoxic-appearing vital signs are reassuring.  He has no reproducible tenderness on exam.  This would argue against pyelonephritis or appendicitis.  Lab work obtained and only notable for leukocytosis.  Urinalysis shows large hemoglobin in the urine.  No evidence of urinary tract infection.  KUB obtained.  Suspect 4 mm stone in the proximal ureter.  On recheck, patient states he feels somewhat better.  Pain is now 6 out of 10.  Patient was given oral pain medication and able to orally hydrate.  Will discharge home with a short course of pain medication, Flomax, Zofran.  Patient has a documented history of narcotic seeking behavior.  However, given acute medical condition, feel that a short course of narcotics is warranted.  After history, exam, and medical workup I feel the patient has been appropriately medically screened and is safe for discharge home. Pertinent diagnoses  were discussed with the patient. Patient was given return precautions.   Final Clinical Impressions(s) / ED Diagnoses   Final diagnoses:  Kidney stone    ED Discharge Orders        Ordered    oxyCODONE-acetaminophen (PERCOCET/ROXICET) 5-325 MG tablet  Every 6 hours PRN     07/12/17 0231    ondansetron (ZOFRAN ODT) 4 MG disintegrating tablet  Every 8 hours PRN     07/12/17 0231    tamsulosin (FLOMAX) 0.4 MG CAPS capsule  Daily     07/12/17 0231       Shon Baton, MD 07/12/17 [phone]

## 2017-07-19 ENCOUNTER — Encounter (HOSPITAL_BASED_OUTPATIENT_CLINIC_OR_DEPARTMENT_OTHER): Payer: Self-pay | Admitting: Emergency Medicine

## 2017-07-19 ENCOUNTER — Other Ambulatory Visit: Payer: Self-pay

## 2017-07-19 ENCOUNTER — Emergency Department (HOSPITAL_BASED_OUTPATIENT_CLINIC_OR_DEPARTMENT_OTHER): Payer: Medicaid Other

## 2017-07-19 ENCOUNTER — Emergency Department (HOSPITAL_BASED_OUTPATIENT_CLINIC_OR_DEPARTMENT_OTHER)
Admission: EM | Admit: 2017-07-19 | Discharge: 2017-07-19 | Disposition: A | Discharge status: Home / Self Care | Payer: Medicaid Other | Attending: Emergency Medicine | Admitting: Emergency Medicine

## 2017-07-19 DIAGNOSIS — R109 Unspecified abdominal pain: Secondary | ICD-10-CM | POA: Diagnosis present

## 2017-07-19 DIAGNOSIS — F17210 Nicotine dependence, cigarettes, uncomplicated: Secondary | ICD-10-CM | POA: Insufficient documentation

## 2017-07-19 DIAGNOSIS — Z79899 Other long term (current) drug therapy: Secondary | ICD-10-CM | POA: Diagnosis not present

## 2017-07-19 DIAGNOSIS — N200 Calculus of kidney: Secondary | ICD-10-CM | POA: Insufficient documentation

## 2017-07-19 LAB — BASIC METABOLIC PANEL
Anion gap: 9 (ref 5–15)
BUN: 15 mg/dL (ref 6–20)
CALCIUM: 8.9 mg/dL (ref 8.9–10.3)
CO2: 27 mmol/L (ref 22–32)
CREATININE: 0.99 mg/dL (ref 0.61–1.24)
Chloride: 104 mmol/L (ref 101–111)
GFR calc Af Amer: >60 mL/min (ref >60)
GLUCOSE: 99 mg/dL (ref 65–99)
Potassium: 3.8 mmol/L (ref 3.5–5.1)
Sodium: 140 mmol/L (ref 135–145)

## 2017-07-19 LAB — URINALYSIS, ROUTINE W REFLEX MICROSCOPIC
Bilirubin Urine: NEGATIVE
GLUCOSE, UA: NEGATIVE mg/dL
Ketones, ur: NEGATIVE mg/dL
Leukocytes, UA: NEGATIVE
Nitrite: NEGATIVE
Protein, ur: NEGATIVE mg/dL
Specific Gravity, Urine: >1.03 — ABNORMAL HIGH (ref 1.005–1.030)
pH: 6 (ref 5.0–8.0)

## 2017-07-19 LAB — CBC WITH DIFFERENTIAL/PLATELET
Basophils Absolute: 0 10*3/uL (ref 0.0–0.1)
Basophils Relative: 0 %
EOS PCT: 3 %
Eosinophils Absolute: 0.3 10*3/uL (ref 0.0–0.7)
HCT: 44.2 % (ref 39.0–52.0)
Hemoglobin: 15.6 g/dL (ref 13.0–17.0)
LYMPHS ABS: 2.4 10*3/uL (ref 0.7–4.0)
LYMPHS PCT: 25 %
MCH: 29.8 pg (ref 26.0–34.0)
MCHC: 35.3 g/dL (ref 30.0–36.0)
MCV: 84.4 fL (ref 78.0–100.0)
MONO ABS: 0.9 10*3/uL (ref 0.1–1.0)
MONOS PCT: 9 %
Neutro Abs: 5.9 10*3/uL (ref 1.7–7.7)
Neutrophils Relative %: 63 %
PLATELETS: 355 10*3/uL (ref 150–400)
RBC: 5.24 MIL/uL (ref 4.22–5.81)
RDW: 12.6 % (ref 11.5–15.5)
WBC: 9.5 10*3/uL (ref 4.0–10.5)

## 2017-07-19 LAB — URINALYSIS, MICROSCOPIC (REFLEX): Bacteria, UA: NONE SEEN

## 2017-07-19 MED ORDER — MORPHINE SULFATE (PF) 4 MG/ML IV SOLN
4.0000 mg | Freq: Once | INTRAVENOUS | Status: AC
  Administered 2017-07-19: 4 mg via INTRAVENOUS
  Filled 2017-07-19: qty 1

## 2017-07-19 MED ORDER — OXYCODONE-ACETAMINOPHEN 5-325 MG PO TABS: 1.0000 | ORAL_TABLET | Freq: Four times a day (QID) | ORAL | 0 refills | Status: DC | PRN

## 2017-07-19 MED ORDER — KETOROLAC TROMETHAMINE 30 MG/ML IJ SOLN
30.0000 mg | Freq: Once | INTRAMUSCULAR | Status: AC
  Administered 2017-07-19: 30 mg via INTRAVENOUS
  Filled 2017-07-19: qty 1

## 2017-07-19 MED ORDER — SODIUM CHLORIDE 0.9 % IV BOLUS
1000.0000 mL | Freq: Once | INTRAVENOUS | Status: AC
  Administered 2017-07-19: 1000 mL via INTRAVENOUS

## 2017-07-19 MED ORDER — ONDANSETRON HCL 4 MG/2ML IJ SOLN
4.0000 mg | Freq: Once | INTRAMUSCULAR | Status: AC
  Administered 2017-07-19: 4 mg via INTRAVENOUS
  Filled 2017-07-19: qty 2

## 2017-07-19 NOTE — ED Provider Notes (Signed)
[facility] EMERGENCY DEPARTMENT Provider Note   CSN: [id number] Arrival date & time: 07/19/17  0102     History   Chief Complaint Chief Complaint  Patient presents with  . Flank Pain    HPI Ricardo Sweeney is a 27 y.o. male.  HPI  This is a 27 year old male with a history of gallstones, kidney stones who presents with worsening right flank pain.  Patient reports that he had been doing well since I last saw him 1 week ago.  He states he had had intermittent pain that it was tolerable.  He has been taking Flomax and pain medication at home.  However tonight, he had worsening right flank pain.  Currently he rates his pain at 9 out of 10.  He also reports difficulty urinating.  Denies any fevers.  Denies any nausea, vomiting.    Past Medical History:  Diagnosis Date  . Clostridium difficile infection   . Drug-seeking behavior   . Gallstones   . IBD (inflammatory bowel disease)   . IBS (irritable bowel syndrome)   . Kidney stone   . Kidney stone   . Multiple gastric ulcers   . Narcotic abuse [facility])     Patient Active Problem List   Diagnosis Date Noted  . Left knee injury, initial encounter 12/25/2015  . Nephrolithiasis 12/26/2013    Past Surgical History:  Procedure Laterality Date  . COLONOSCOPY    . kidney stone stent    . LITHOTRIPSY          Home Medications    Prior to Admission medications   Medication Sig Start Date End Date Taking? Authorizing Provider  cephALEXin (KEFLEX) 500 MG capsule Take 1 capsule (500 mg total) by mouth 2 (two) times daily. 09/01/16   Rise Mu, PA-C  dicyclomine (BENTYL) 20 MG tablet Take 1 tablet (20 mg total) by mouth 2 (two) times daily. 09/18/16   Maxwell Caul, PA-C  Multiple Vitamin (MULTIVITAMIN) tablet Take 1 tablet by mouth daily.    [provider]  ondansetron (ZOFRAN ODT) 4 MG disintegrating tablet Take 1 tablet (4 mg total) by mouth every 8 (eight) hours as needed for nausea or vomiting.  07/12/17   Bell Carbo, Mayer Masker, MD  oxyCODONE (OXY IR/ROXICODONE) 5 MG immediate release tablet Take 5 mg by mouth every 4 (four) hours as needed for severe pain.    [provider]  oxyCODONE-acetaminophen (PERCOCET/ROXICET) 5-325 MG tablet Take 1 tablet by mouth every 6 (six) hours as needed for severe pain. 07/19/17   Elder Davidian, Mayer Masker, MD  promethazine (PHENERGAN) 25 MG tablet Take 25 mg by mouth every 6 (six) hours as needed for nausea or vomiting.    [provider]  tamsulosin (FLOMAX) 0.4 MG CAPS capsule Take 1 capsule (0.4 mg total) by mouth daily. 07/12/17   Donald Jacque, Mayer Masker, MD    Family History No family history on file.  Social History Social History   Tobacco Use  . Smoking status: Current Every Day Smoker    Types: Cigarettes  . Smokeless tobacco: Current User    Types: Chew  Substance Use Topics  . Alcohol use: Yes    Comment: occ  . Drug use: No     Allergies   Tape and Ultram [tramadol]   Review of Systems Review of Systems  Constitutional: Negative for fever.  Respiratory: Negative for shortness of breath.   Cardiovascular: Negative for chest pain.  Gastrointestinal: Negative for abdominal pain, nausea and vomiting.  Genitourinary: Positive for difficulty urinating, flank pain and hematuria.  Musculoskeletal: Negative for back pain.  All other systems reviewed and are negative.    Physical Exam Updated Vital Signs BP 115/90 (BP Location: Right Arm)   Pulse 76   Temp 98.2 F (36.8 C) (Oral)   Resp 16   SpO2 98%   Physical Exam  Constitutional: He is oriented to person, place, and time. He appears well-developed and well-nourished. No distress.  HENT:  Head: Normocephalic and atraumatic.  Neck: Neck supple.  Cardiovascular: Normal rate, regular rhythm and normal heart sounds.  No murmur heard. Pulmonary/Chest: Effort normal and breath sounds normal. No respiratory distress. He has no wheezes.  Abdominal: Soft. Bowel sounds  are normal. There is no tenderness. There is no rebound.  No CVA tenderness  Musculoskeletal: He exhibits no edema.  Lymphadenopathy:    He has no cervical adenopathy.  Neurological: He is alert and oriented to person, place, and time.  Skin: Skin is warm and dry.  Psychiatric: He has a normal mood and affect.  Nursing note and vitals reviewed.    ED Treatments / Results  Labs (all labs ordered are listed, but only abnormal results are displayed) Labs Reviewed  URINALYSIS, ROUTINE W REFLEX MICROSCOPIC - Abnormal; Notable for the following components:      Result Value   APPearance HAZY (*)    Specific Gravity, Urine >1.030 (*)    Hgb urine dipstick MODERATE (*)    All other components within normal limits  CBC WITH DIFFERENTIAL/PLATELET  BASIC METABOLIC PANEL  URINALYSIS, MICROSCOPIC (REFLEX)    EKG None  Radiology Ct Renal Stone Study  Result Date: 07/19/2017 CLINICAL DATA:  Right lower flank pain. Previous history of kidney stones. EXAM: CT ABDOMEN AND PELVIS WITHOUT CONTRAST TECHNIQUE: Multidetector CT imaging of the abdomen and pelvis was performed following the standard protocol without IV contrast. COMPARISON:  11/04/2016 FINDINGS: Lower chest: Mild dependent atelectasis in the lung bases. Hepatobiliary: No focal liver abnormality is seen. No gallstones, gallbladder wall thickening, or biliary dilatation. Pancreas: Unremarkable. No pancreatic ductal dilatation or surrounding inflammatory changes. Spleen: Normal in size without focal abnormality. Adrenals/Urinary Tract: No adrenal gland nodules. Right renal hydronephrosis and hydroureter with a 5 mm stone in the distal right ureter at the ureterovesical junction. Stranding around the right kidney and ureter. Left kidney and ureter are unremarkable. No bladder wall thickening. Stomach/Bowel: Stomach is within normal limits. Appendix appears normal. No evidence of bowel wall thickening, distention, or inflammatory changes.  Vascular/Lymphatic: No significant vascular findings are present. No enlarged abdominal or pelvic lymph nodes. Reproductive: Prostate is unremarkable. Other: No abdominal wall hernia or abnormality. No abdominopelvic ascites. Musculoskeletal: No acute or significant osseous findings. IMPRESSION: 5 mm stone in the distal right ureter with moderate proximal obstruction. Electronically Signed   By: Burman Nieves M.D.   On: 07/19/2017 04:11    Procedures Procedures (including critical care time)  Medications Ordered in ED Medications  sodium chloride 0.9 % bolus 1,000 mL (0 mLs Intravenous Stopped 07/19/17 0426)  morphine 4 MG/ML injection 4 mg (4 mg Intravenous Given 07/19/17 0357)  ondansetron (ZOFRAN) injection 4 mg (4 mg Intravenous Given 07/19/17 0357)  ketorolac (TORADOL) 30 MG/ML injection 30 mg (30 mg Intravenous Given 07/19/17 0456)  morphine 4 MG/ML injection 4 mg (4 mg Intravenous Given 07/19/17 0456)     Initial Impression / Assessment and Plan / ED Course  I have reviewed the triage vital signs and the nursing notes.  Pertinent  labs & imaging results that were available during my care of the patient were reviewed by me and considered in my medical decision making (see chart for details).     Patient presents with persistent right flank pain that initially improved but worsened over the last several days.  He is overall nontoxic-appearing.  I saw the patient 1 week ago and an x-ray which was suspicious for a 4 mm stone.  He was discharged home with Flomax and Percocet.  He states he is continuing to take Flomax.  Lab work-up reinitiated.  BMP with preserved renal function.  Given ongoing discomfort, CT scan was obtained.  CT scan shows a 5 mm stone at the right distal ureter.  Patient was given pain and nausea medication as well as fluids.  I discussed with patient's options including talking with his urologist versus pain control and attempting to follow-up with urology as an  outpatient.  He would like to try to follow-up as an outpatient.  He is able to orally hydrate.  Patient was given a short course of pain medication last week that would have lasted him approximately 3 and half days.  I will give him 6 more Percocet in an attempt to bridge him to outpatient follow-up with his urologist.  After history, exam, and medical workup I feel the patient has been appropriately medically screened and is safe for discharge home. Pertinent diagnoses were discussed with the patient. Patient was given return precautions.   Final Clinical Impressions(s) / ED Diagnoses   Final diagnoses:  Kidney stone    ED Discharge Orders        Ordered    oxyCODONE-acetaminophen (PERCOCET/ROXICET) 5-325 MG tablet  Every 6 hours PRN     07/19/17 0552       Shon Baton, MD 07/19/17 [phone]

## 2017-07-19 NOTE — Discharge Instructions (Addendum)
You were seen today for kidney stones.  This was confirmed.  You have a 5 mm stone in the distal ureter.  Hopefully this will pass soon.  You will be given a very short course of pain medication.  It is very important that you follow-up with your urologist if you have continued or ongoing pain.  Continue to take Flomax as directed.

## 2017-07-19 NOTE — ED Notes (Signed)
Patient transported to CT 

## 2017-07-19 NOTE — ED Triage Notes (Signed)
PT presents with c/o right flank pain

## 2017-09-27 ENCOUNTER — Encounter (HOSPITAL_BASED_OUTPATIENT_CLINIC_OR_DEPARTMENT_OTHER): Payer: Self-pay | Admitting: Emergency Medicine

## 2017-09-27 ENCOUNTER — Emergency Department (HOSPITAL_BASED_OUTPATIENT_CLINIC_OR_DEPARTMENT_OTHER)
Admission: EM | Admit: 2017-09-27 | Discharge: 2017-09-27 | Disposition: A | Discharge status: Home / Self Care | Payer: Medicaid Other | Attending: Emergency Medicine | Admitting: Emergency Medicine

## 2017-09-27 ENCOUNTER — Other Ambulatory Visit: Payer: Self-pay

## 2017-09-27 DIAGNOSIS — Z79899 Other long term (current) drug therapy: Secondary | ICD-10-CM | POA: Insufficient documentation

## 2017-09-27 DIAGNOSIS — R1084 Generalized abdominal pain: Secondary | ICD-10-CM | POA: Diagnosis not present

## 2017-09-27 DIAGNOSIS — F17210 Nicotine dependence, cigarettes, uncomplicated: Secondary | ICD-10-CM | POA: Insufficient documentation

## 2017-09-27 DIAGNOSIS — R109 Unspecified abdominal pain: Secondary | ICD-10-CM

## 2017-09-27 HISTORY — DX: Encounter for other administrative examinations: Z02.89

## 2017-09-27 HISTORY — DX: Other chronic pain: G89.29

## 2017-09-27 LAB — CBC WITH DIFFERENTIAL/PLATELET
Basophils Absolute: 0 10*3/uL (ref 0.0–0.1)
Basophils Relative: 1 %
EOS ABS: 0.3 10*3/uL (ref 0.0–0.7)
EOS PCT: 5 %
HCT: 45.8 % (ref 39.0–52.0)
Hemoglobin: 16 g/dL (ref 13.0–17.0)
LYMPHS ABS: 1.4 10*3/uL (ref 0.7–4.0)
Lymphocytes Relative: 21 %
MCH: 29.5 pg (ref 26.0–34.0)
MCHC: 34.9 g/dL (ref 30.0–36.0)
MCV: 84.5 fL (ref 78.0–100.0)
Monocytes Absolute: 0.5 10*3/uL (ref 0.1–1.0)
Monocytes Relative: 8 %
Neutro Abs: 4.3 10*3/uL (ref 1.7–7.7)
Neutrophils Relative %: 65 %
PLATELETS: 332 10*3/uL (ref 150–400)
RBC: 5.42 MIL/uL (ref 4.22–5.81)
RDW: 12.6 % (ref 11.5–15.5)
WBC: 6.5 10*3/uL (ref 4.0–10.5)

## 2017-09-27 LAB — URINALYSIS, ROUTINE W REFLEX MICROSCOPIC
BILIRUBIN URINE: NEGATIVE
Glucose, UA: NEGATIVE mg/dL
HGB URINE DIPSTICK: NEGATIVE
KETONES UR: NEGATIVE mg/dL
Leukocytes, UA: NEGATIVE
Nitrite: NEGATIVE
PROTEIN: NEGATIVE mg/dL
Specific Gravity, Urine: 1.015 (ref 1.005–1.030)
pH: 7.5 (ref 5.0–8.0)

## 2017-09-27 LAB — BASIC METABOLIC PANEL
Anion gap: 8 (ref 5–15)
BUN: 12 mg/dL (ref 6–20)
CO2: 28 mmol/L (ref 22–32)
CREATININE: 0.85 mg/dL (ref 0.61–1.24)
Calcium: 9 mg/dL (ref 8.9–10.3)
Chloride: 106 mmol/L (ref 98–111)
GFR calc Af Amer: >60 mL/min (ref >60)
Glucose, Bld: 94 mg/dL (ref 70–99)
POTASSIUM: 4.1 mmol/L (ref 3.5–5.1)
SODIUM: 142 mmol/L (ref 135–145)

## 2017-09-27 MED ORDER — ONDANSETRON HCL 4 MG/2ML IJ SOLN
4.0000 mg | Freq: Once | INTRAMUSCULAR | Status: AC
  Administered 2017-09-27: 4 mg via INTRAVENOUS
  Filled 2017-09-27: qty 2

## 2017-09-27 MED ORDER — OXYCODONE HCL 5 MG PO TABS
10.0000 mg | ORAL_TABLET | Freq: Once | ORAL | Status: AC
  Administered 2017-09-27: 10 mg via ORAL
  Filled 2017-09-27: qty 2

## 2017-09-27 MED ORDER — KETOROLAC TROMETHAMINE 15 MG/ML IJ SOLN
15.0000 mg | Freq: Once | INTRAMUSCULAR | Status: AC
  Administered 2017-09-27: 15 mg via INTRAVENOUS
  Filled 2017-09-27: qty 1

## 2017-09-27 NOTE — ED Notes (Signed)
Wife is on the way, should be here in 10 minutes according to patient.

## 2017-09-27 NOTE — ED Notes (Signed)
Pt waiting for wife to come to department to give narcotics.  Pt states she is in the parking lot.

## 2017-09-27 NOTE — ED Notes (Signed)
Pt discharged to care of sister to drive him home.

## 2017-09-27 NOTE — ED Notes (Signed)
Pt given urinal and urinalysis cup for specimen.  Pt states he went while in waiting room prior to visit.

## 2017-09-27 NOTE — ED Triage Notes (Signed)
Right flank pain since 5 am.  Pt having N/V.  No fever.  History of kidney stones.

## 2017-09-27 NOTE — ED Provider Notes (Signed)
[facility] EMERGENCY DEPARTMENT Provider Note   CSN: [id number] Arrival date & time: 09/27/17  0732     History   Chief Complaint Chief Complaint  Patient presents with  . Flank Pain    HPI Ricardo Sweeney is a 27 y.o. male.  The history is provided by the patient.  Flank Pain  This is a recurrent problem. The current episode started 3 to 5 hours ago. The problem occurs constantly. The problem has not changed since onset.Associated symptoms comments: Right flank pain that radiates into the right lower abd. no dysuria, frequency or urgency.  No fevers but since the pain is started he has had nausea and vomiting.  No diarrhea.  Patient states he does have a history of kidney stones with lithotripsy and stenting done last time over a year ago.  Patient does take chronic narcotics at home but states he tried to take them today and vomited everything up.. Nothing aggravates the symptoms. Nothing relieves the symptoms. Treatments tried: home meds. The treatment provided no relief.    Past Medical History:  Diagnosis Date  . Chronic pain   . Clostridium difficile infection   . Drug-seeking behavior   . Gallstones   . IBD (inflammatory bowel disease)   . IBS (irritable bowel syndrome)   . Kidney stone   . Kidney stone   . Multiple gastric ulcers   . Narcotic abuse ([facility])   . Pain management contract agreement     Patient Active Problem List   Diagnosis Date Noted  . Left knee injury, initial encounter 12/25/2015  . Nephrolithiasis 12/26/2013    Past Surgical History:  Procedure Laterality Date  . COLONOSCOPY    . kidney stone stent    . LITHOTRIPSY          Home Medications    Prior to Admission medications   Medication Sig Start Date End Date Taking? Authorizing Provider  promethazine (PHENERGAN) 25 MG tablet Take 25 mg by mouth every 6 (six) hours as needed for nausea or vomiting.   Yes [provider]  Oxycodone HCl 10 MG TABS Take 10 mg by  mouth every 4 (four) hours as needed. 09/17/17   [provider]  pantoprazole (PROTONIX) 40 MG tablet Take 40 mg by mouth daily. 08/27/17   [provider]  tamsulosin (FLOMAX) 0.4 MG CAPS capsule Take 1 capsule (0.4 mg total) by mouth daily. 07/12/17   Horton, Mayer Maskerourtney F, MD    Family History No family history on file.  Social History Social History   Tobacco Use  . Smoking status: Current Every Day Smoker    Types: Cigarettes  . Smokeless tobacco: Current User    Types: Chew  Substance Use Topics  . Alcohol use: Yes    Comment: occ  . Drug use: No     Allergies   Tape and Ultram [tramadol]   Review of Systems Review of Systems  Genitourinary: Positive for flank pain.  All other systems reviewed and are negative.    Physical Exam Updated Vital Signs BP (!) 143/96 (BP Location: Right Arm)   Pulse 88   Temp 97.8 F (36.6 C) (Oral)   Resp 16   Ht 5\' 9"  (1.753 m)   Wt 110.2 kg (243 lb)   SpO2 100%   BMI 35.88 kg/m   Physical Exam  Constitutional: He is oriented to person, place, and time. He appears well-developed and well-nourished. No distress.  HENT:  Head: Normocephalic and atraumatic.  Mouth/Throat:  Oropharynx is clear and moist.  Eyes: Pupils are equal, round, and reactive to light. Conjunctivae and EOM are normal.  Neck: Normal range of motion. Neck supple.  Cardiovascular: Normal rate, regular rhythm and intact distal pulses.  No murmur heard. Pulmonary/Chest: Effort normal and breath sounds normal. No respiratory distress. He has no wheezes. He has no rales.  Abdominal: Soft. He exhibits no distension. There is no tenderness. There is CVA tenderness. There is no rebound and no guarding.  Musculoskeletal: Normal range of motion. He exhibits no edema or tenderness.  Neurological: He is alert and oriented to person, place, and time.  Skin: Skin is warm and dry. No rash noted. No erythema.  Psychiatric: He has a normal mood and affect. His  behavior is normal.  Nursing note and vitals reviewed.    ED Treatments / Results  Labs (all labs ordered are listed, but only abnormal results are displayed) Labs Reviewed  URINALYSIS, ROUTINE W REFLEX MICROSCOPIC  CBC WITH DIFFERENTIAL/PLATELET  BASIC METABOLIC PANEL    EKG None  Radiology No results found.  Procedures Procedures (including critical care time)  Medications Ordered in ED Medications  ondansetron (ZOFRAN) injection 4 mg (has no administration in time range)  ketorolac (TORADOL) 15 MG/ML injection 15 mg (has no administration in time range)     Initial Impression / Assessment and Plan / ED Course  I have reviewed the triage vital signs and the nursing notes.  Pertinent labs & imaging results that were available during my care of the patient were reviewed by me and considered in my medical decision making (see chart for details).     Pt with symptoms consistent with kidney stone.  Denies infectious sx, or GI symptoms.  Low concern for diverticulitis and no risk factors or history suggestive of AAA.  No hx suggestive of GU source (discharge) and otherwise pt is healthy.  Will treat pain initially with nonnarcotic options given patient's tendency for drug-seeking behavior based on charts and ensure no infection with UA, CBC, BMP.    9:43 AM Awaiting on urine and patient CBC and BMP are within normal limits.  Patient was given Toradol but states he had no significant improvement in his pain.  Suspect that patient may be drug-seeking.  We will give him 1 dose of his home pain medication but will not be giving IV pain medication.  UA also wnl without blood.  Patient encouraged to take his medication he has at home and follow-up with Dr. Mena Goes if his symptoms do not improve.  Final Clinical Impressions(s) / ED Diagnoses   Final diagnoses:  Right flank pain    ED Discharge Orders    None       Gwyneth Sprout, MD 09/27/17 1022

## 2017-09-27 NOTE — ED Notes (Signed)
Pt states his wife is in the parking lot, informed patient that his driver has to be present in the ED in order to receive narcotics as patient came to department alone.

## 2019-03-12 IMAGING — CT CT RENAL STONE PROTOCOL
2 of 4 series · 17 of 46 positions shown, 19 images · non-contrast
Comparison: 11/04/2016

CLINICAL DATA: Right lower flank pain. Previous history of kidney
stones.

EXAM:
CT ABDOMEN AND PELVIS WITHOUT CONTRAST
TECHNIQUE: Multidetector CT imaging of the abdomen and pelvis was performed
following the standard protocol without IV contrast.

[Series 2: axial st · axial · 0.80mm/px · z∈[+673,+1113]mm · 14 of 97 slices shown, 16 images]
[im 5/97  soft-tissue]
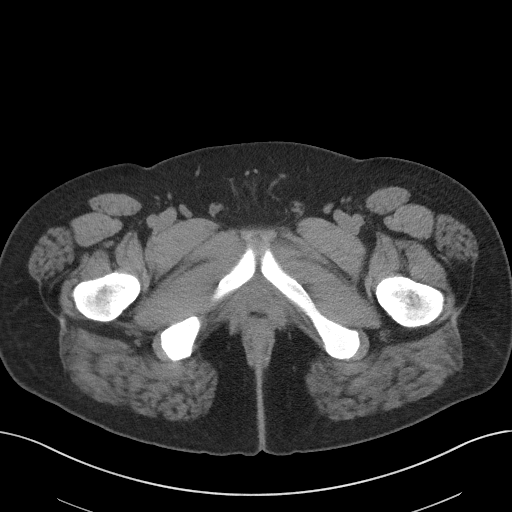
[im 5/97  bone]
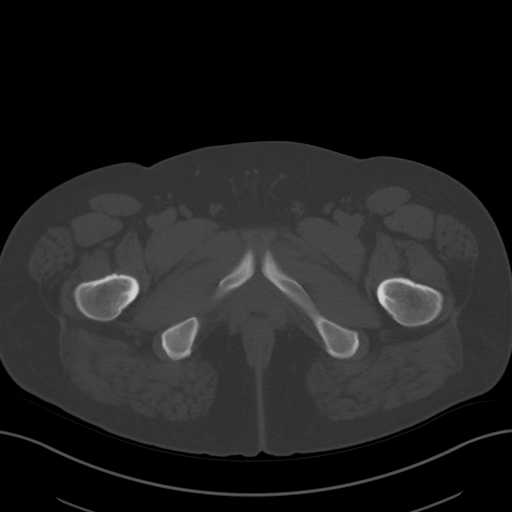
[im 13/97  soft-tissue]
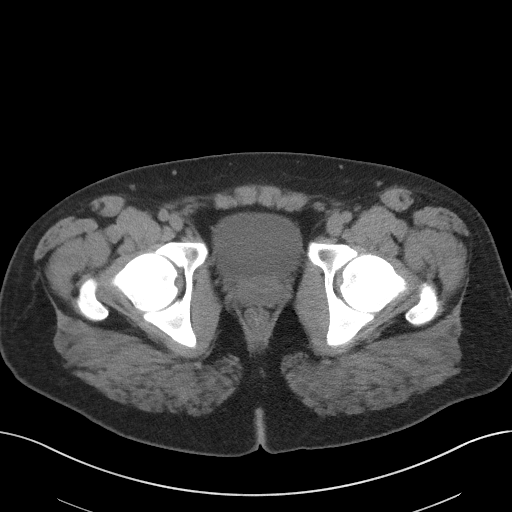
[im 21/97  soft-tissue]
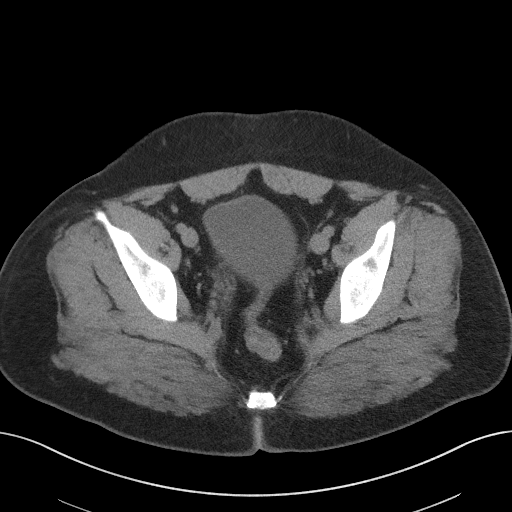
[im 25/97  soft-tissue]
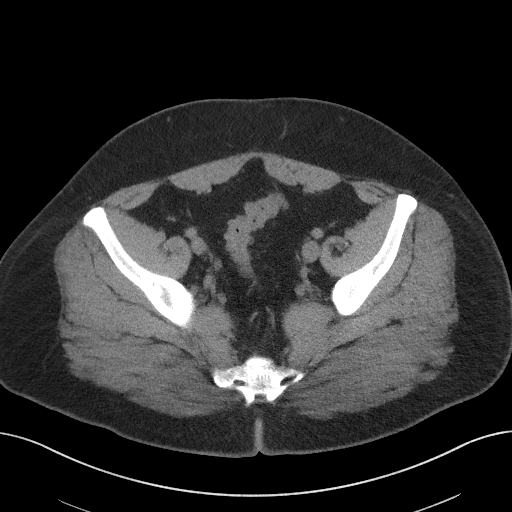
[im 33/97  soft-tissue]
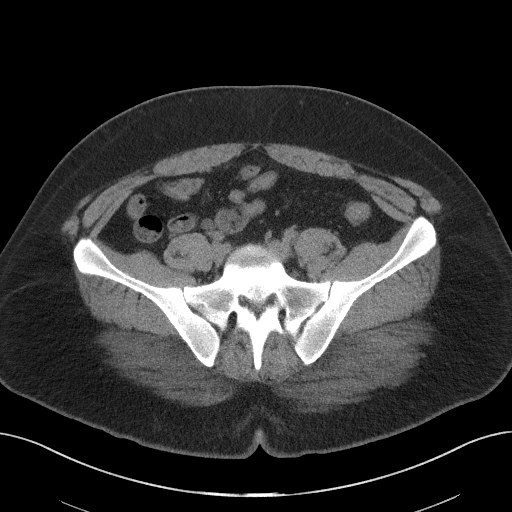
[im 41/97  soft-tissue]
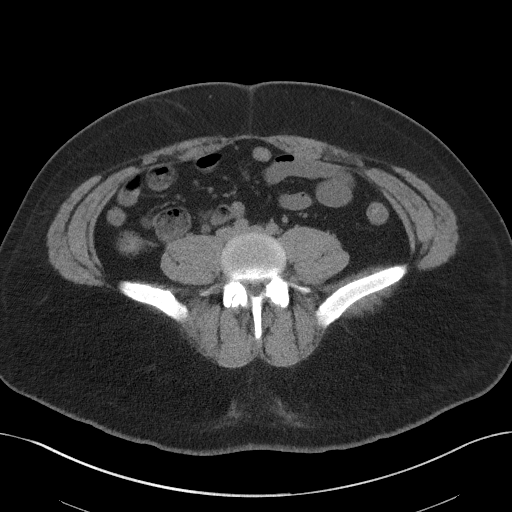
[im 45/97  soft-tissue]
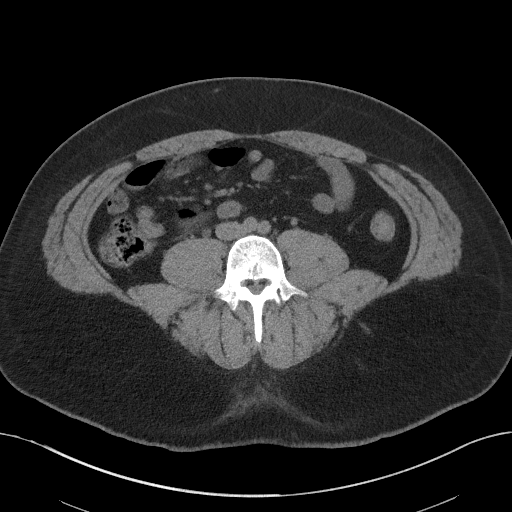
[im 53/97  soft-tissue]
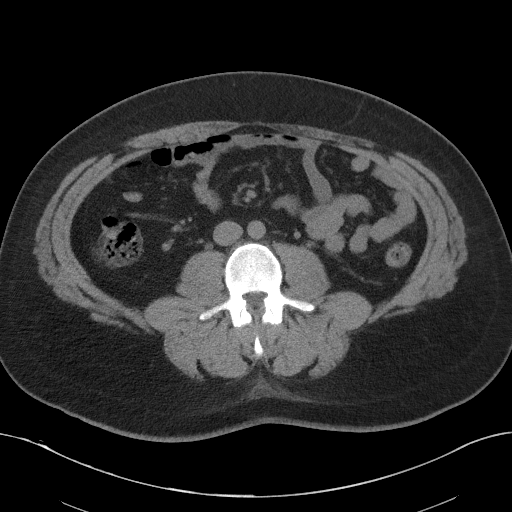
[im 57/97  soft-tissue]
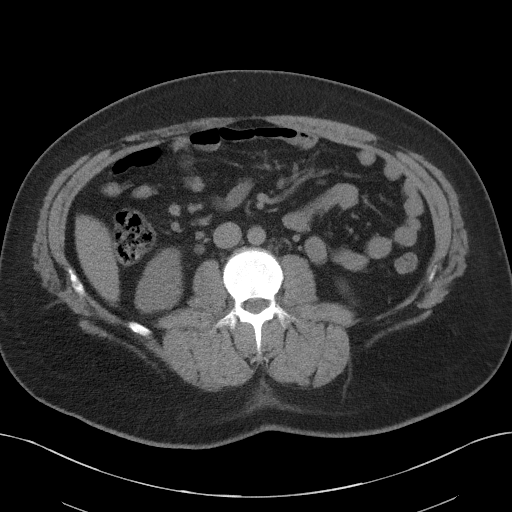
[im 57/97  bone]
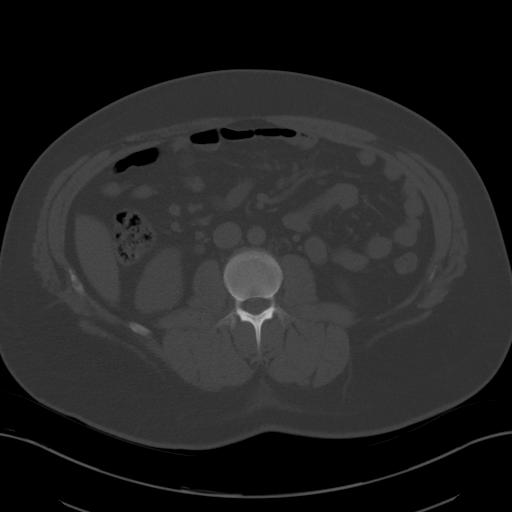
[im 65/97  soft-tissue]
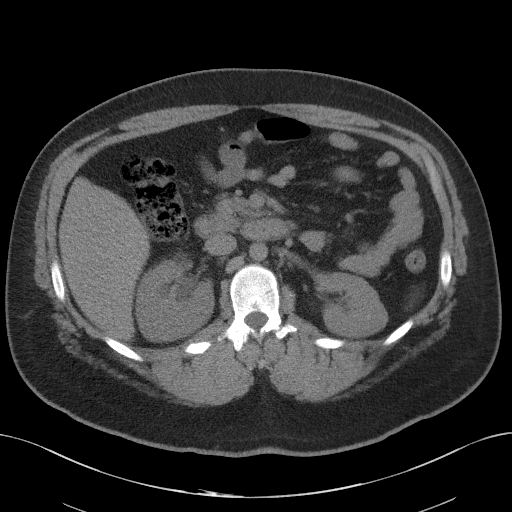
[im 73/97  soft-tissue]
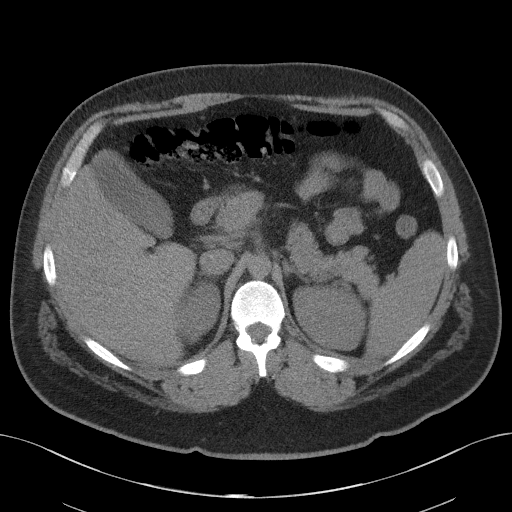
[im 77/97  soft-tissue]
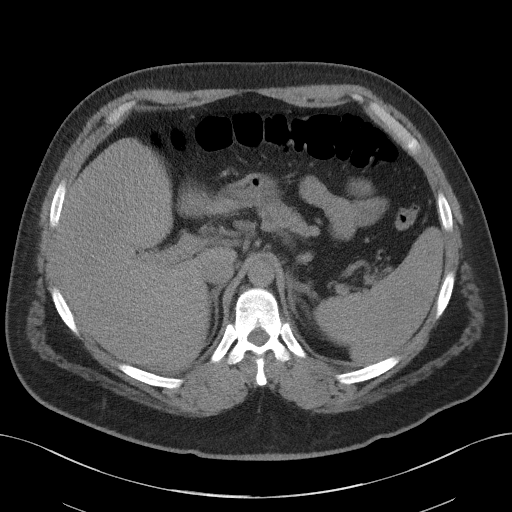
[im 85/97  soft-tissue]
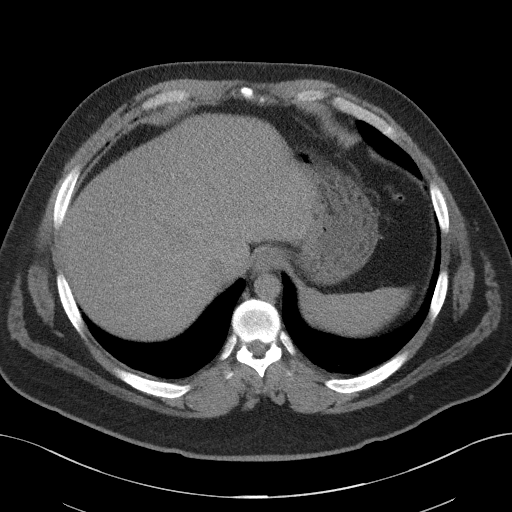
[im 93/97  soft-tissue]
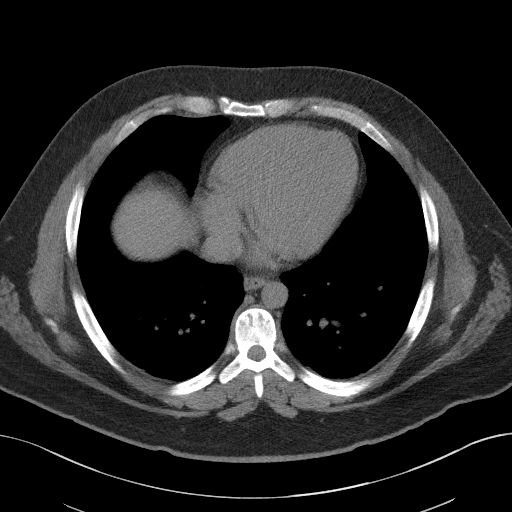

[Series 5: coronal st · coronal · 0.85mm/px · 3 of 108 slices shown]
[im 36/108  soft-tissue]
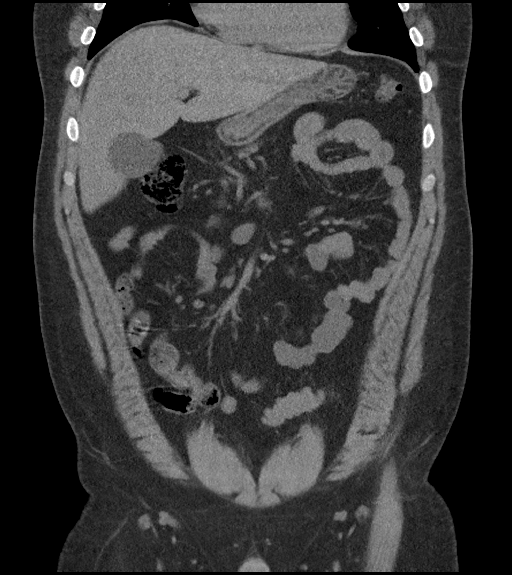
[im 48/108  soft-tissue]
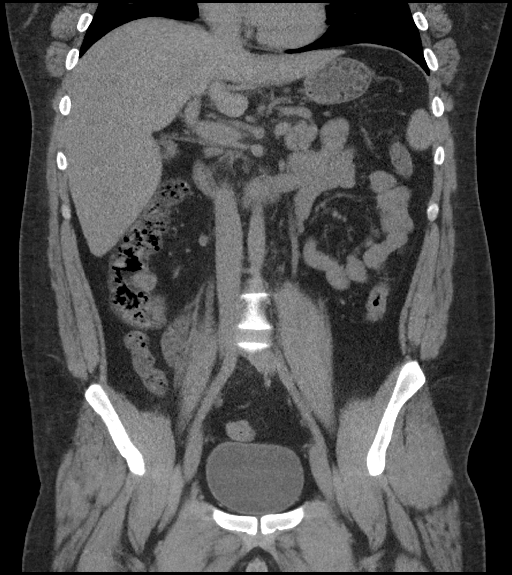
[im 60/108  soft-tissue]
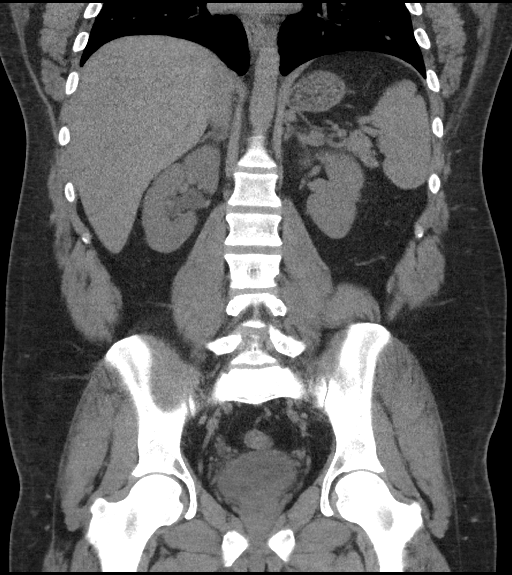

[17 of 46 positions shown; findings below may reference images not displayed]

FINDINGS: Lower chest: Mild dependent atelectasis in the lung bases.

Hepatobiliary: No focal liver abnormality is seen. No gallstones,
gallbladder wall thickening, or biliary dilatation.

Pancreas: Unremarkable. No pancreatic ductal dilatation or
surrounding inflammatory changes.

Spleen: Normal in size without focal abnormality.

Adrenals/Urinary Tract: No adrenal gland nodules. Right renal
hydronephrosis and hydroureter with a 5 mm stone in the distal right
ureter at the ureterovesical junction. Stranding around the right
kidney and ureter. Left kidney and ureter are unremarkable. No
bladder wall thickening.

Stomach/Bowel: Stomach is within normal limits. Appendix appears
normal. No evidence of bowel wall thickening, distention, or
inflammatory changes.

Vascular/Lymphatic: No significant vascular findings are present. No
enlarged abdominal or pelvic lymph nodes.

Reproductive: Prostate is unremarkable.

Other: No abdominal wall hernia or abnormality. No abdominopelvic
ascites.

Musculoskeletal: No acute or significant osseous findings.
IMPRESSION: 5 mm stone in the distal right ureter with moderate proximal
obstruction.
